# Patient Record
Sex: Male | Born: 1950 | Race: Black or African American | Hispanic: No | Marital: Married | Smoking: Never smoker
Health system: Southern US, Community
[De-identification: ages and names within clinical notes are randomized; demographics above are authoritative.]

## PROBLEM LIST (undated history)

## (undated) DIAGNOSIS — I10 Essential (primary) hypertension: Secondary | ICD-10-CM

## (undated) DIAGNOSIS — E785 Hyperlipidemia, unspecified: Secondary | ICD-10-CM

## (undated) DIAGNOSIS — I739 Peripheral vascular disease, unspecified: Secondary | ICD-10-CM

## (undated) DIAGNOSIS — M1990 Unspecified osteoarthritis, unspecified site: Secondary | ICD-10-CM

## (undated) HISTORY — PX: ESOPHAGOGASTRODUODENOSCOPY: SHX1529

## (undated) HISTORY — PX: HERNIA REPAIR: SHX51

## (undated) HISTORY — DX: Unspecified osteoarthritis, unspecified site: M19.90

## (undated) HISTORY — DX: Essential (primary) hypertension: I10

## (undated) HISTORY — DX: Hyperlipidemia, unspecified: E78.5

---

## 2012-03-27 LAB — URINALYSIS, COMPLETE
Bacteria: NONE SEEN
Bilirubin,UR: NEGATIVE
Blood: NEGATIVE
Glucose,UR: NEGATIVE mg/dL (ref 0–75)
Ketone: NEGATIVE
Leukocyte Esterase: NEGATIVE
Nitrite: NEGATIVE
Ph: 5 (ref 4.5–8.0)
Protein: 30
RBC,UR: 3 /HPF (ref 0–5)
Specific Gravity: 1.027 (ref 1.003–1.030)
Squamous Epithelial: 1
WBC UR: 4 /HPF (ref 0–5)

## 2012-03-27 LAB — BASIC METABOLIC PANEL
Anion Gap: 7 (ref 7–16)
BUN: 26 mg/dL — ABNORMAL HIGH (ref 7–18)
Calcium, Total: 7.9 mg/dL — ABNORMAL LOW (ref 8.5–10.1)
Chloride: 100 mmol/L (ref 98–107)
Co2: 31 mmol/L (ref 21–32)
Creatinine: 1.92 mg/dL — ABNORMAL HIGH (ref 0.60–1.30)
EGFR (African American): 43 — ABNORMAL LOW
EGFR (Non-African Amer.): 37 — ABNORMAL LOW
Glucose: 150 mg/dL — ABNORMAL HIGH (ref 65–99)
Osmolality: 283 (ref 275–301)
Potassium: 2.7 mmol/L — ABNORMAL LOW (ref 3.5–5.1)
Sodium: 138 mmol/L (ref 136–145)

## 2012-03-27 LAB — TROPONIN I: Troponin-I: 0.02 ng/mL

## 2012-03-27 LAB — LIPASE, BLOOD: Lipase: 129 U/L (ref 73–393)

## 2012-03-27 LAB — CBC
HCT: 47.1 % (ref 40.0–52.0)
HGB: 15.6 g/dL (ref 13.0–18.0)
MCH: 29.5 pg (ref 26.0–34.0)
MCHC: 33.1 g/dL (ref 32.0–36.0)
MCV: 89 fL (ref 80–100)
Platelet: 249 10*3/uL (ref 150–440)
RBC: 5.27 10*6/uL (ref 4.40–5.90)
RDW: 13.6 % (ref 11.5–14.5)
WBC: 8.6 10*3/uL (ref 3.8–10.6)

## 2012-03-28 ENCOUNTER — Inpatient Hospital Stay: Payer: Self-pay | Admitting: Surgery

## 2012-03-29 LAB — CBC WITH DIFFERENTIAL/PLATELET
Basophil #: 0 10*3/uL (ref 0.0–0.1)
Basophil %: 0.3 %
Eosinophil #: 0.3 10*3/uL (ref 0.0–0.7)
Eosinophil %: 3.5 %
HCT: 40.5 % (ref 40.0–52.0)
HGB: 13.3 g/dL (ref 13.0–18.0)
Lymphocyte #: 2.6 10*3/uL (ref 1.0–3.6)
Lymphocyte %: 33.4 %
MCH: 29.9 pg (ref 26.0–34.0)
MCHC: 32.9 g/dL (ref 32.0–36.0)
MCV: 91 fL (ref 80–100)
Monocyte #: 0.8 x10 3/mm (ref 0.2–1.0)
Monocyte %: 9.9 %
Neutrophil #: 4.1 10*3/uL (ref 1.4–6.5)
Neutrophil %: 52.9 %
Platelet: 198 10*3/uL (ref 150–440)
RBC: 4.46 10*6/uL (ref 4.40–5.90)
RDW: 13.6 % (ref 11.5–14.5)
WBC: 7.7 10*3/uL (ref 3.8–10.6)

## 2012-03-29 LAB — BASIC METABOLIC PANEL
Anion Gap: 6 — ABNORMAL LOW (ref 7–16)
BUN: 29 mg/dL — ABNORMAL HIGH (ref 7–18)
Calcium, Total: 7.4 mg/dL — ABNORMAL LOW (ref 8.5–10.1)
Chloride: 105 mmol/L (ref 98–107)
Co2: 29 mmol/L (ref 21–32)
Creatinine: 1.51 mg/dL — ABNORMAL HIGH (ref 0.60–1.30)
Glucose: 116 mg/dL — ABNORMAL HIGH (ref 65–99)
Osmolality: 286 (ref 275–301)
Potassium: 3.5 mmol/L (ref 3.5–5.1)
Sodium: 140 mmol/L (ref 136–145)

## 2013-12-07 ENCOUNTER — Ambulatory Visit: Payer: Self-pay | Admitting: Vascular Surgery

## 2013-12-07 LAB — CREATININE, SERUM
Creatinine: 1.41 mg/dL — ABNORMAL HIGH (ref 0.60–1.30)
EGFR (African American): >60
EGFR (Non-African Amer.): 54 — ABNORMAL LOW

## 2013-12-07 LAB — BUN: BUN: 24 mg/dL — ABNORMAL HIGH (ref 7–18)

## 2013-12-28 ENCOUNTER — Ambulatory Visit: Payer: Self-pay | Admitting: Vascular Surgery

## 2013-12-28 LAB — BASIC METABOLIC PANEL
Anion Gap: 8 (ref 7–16)
BUN: 21 mg/dL — ABNORMAL HIGH (ref 7–18)
Calcium, Total: 8.5 mg/dL (ref 8.5–10.1)
Chloride: 101 mmol/L (ref 98–107)
Co2: 33 mmol/L — ABNORMAL HIGH (ref 21–32)
Creatinine: 1.24 mg/dL (ref 0.60–1.30)
EGFR (African American): >60
EGFR (Non-African Amer.): >60
Glucose: 104 mg/dL — ABNORMAL HIGH (ref 65–99)
Osmolality: 286 (ref 275–301)
Potassium: 3 mmol/L — ABNORMAL LOW (ref 3.5–5.1)
Sodium: 142 mmol/L (ref 136–145)

## 2014-05-11 NOTE — Discharge Summary (Signed)
PATIENT NAME:  [name], [name] MR#:  [id number] DATE OF BIRTH:  12/07/1950  DATE OF ADMISSION:  03/28/2012 DATE OF DISCHARGE:  03/30/2012  BRIEF HISTORY: The patient is a 64 year old gentleman admitted through the Emergency Room with abdominal pain, nausea, vomiting and diarrhea. He had a 48-hour history of significant abdominal pain and diarrhea, followed by profound nausea and vomiting with fever and sweats. Thought he had a 24-hour bug but then began to develop significant abdominal crampy discomfort. He has had no previous similar symptoms. CT scan was performed in the Emergency Room, which demonstrated a markedly distended proximal bowel with distal decompressed small bowel, gas and stool in the colon suggestive of partial small bowel obstruction. His white blood cell count was normal. He was admitted to the hospital, placed on IV rehydration. He was not placed on nasogastric suction, as he had not had any vomiting since admission to the Emergency Room. He began to pass some gas later that day, was advanced to a liquid diet on the 11th and then to a soft diet on the 12th. He was discharged home on the 12th. He will follow up in the office in 7 to 10 days' time as necessary. He was discharged home on benazepril 20 mg once a day, hydrochlorothiazide 25 mg once a day, Cialis 20 mg once a day, Bystolic 5 mg once a day, benazepril/hydrochlorothiazide 10/12.5 mg once a day and Protonix 40 mg p.o. once a day.   FINAL DISCHARGE DIAGNOSIS: Abdominal pain, nausea, vomiting, diarrhea.   ____________________________ Carmie Endalph L. Ely III, MD rle:jm D: 04/07/2012 14:52:02 ET T: 04/07/2012 15:09:58 ET JOB#: [id number]  cc: Carmie Endalph L. Ely III, MD, <Dictator> Durward MallardJoel Wood. Marguerite OleaMoffett, MD Quentin OreALPH L ELY MD ELECTRONICALLY SIGNED 04/08/2012 16:27

## 2014-05-11 NOTE — H&P (Signed)
PATIENT NAME:  [name], [name] MR#:  [id number] DATE OF BIRTH:  December 05, 1950  DATE OF ADMISSION:  03/27/2012  PRIMARY CARE PHYSICIAN:  [name].  ADMITTING PHYSICIAN:  Dr. Michela Wood.   CHIEF COMPLAINT: Abdominal pain, nausea, vomiting, diarrhea.   BRIEF HISTORY:  The patient is a 64 year old gentleman with a 48-hour history of diarrhea and abdominal pain. Symptoms started 48 hours ago with significant diarrhea. He followed with profound nausea and vomiting with fever and sweats. He felt that he had a 24-hour viral syndrome. He continued to have significant vomiting and then felt as though he "bruised" something in his abdomen. He began to develop crampy abdominal pain with 10 to 15 seconds of abdominal pain every 20 to 30 minutes. The pain increased in intensity over the last 24 hours. He presented to the Emergency Room for further evaluation. He has had no vomiting in 24 hours. He is mildly nauseated. His diarrhea has stopped.   He denies any previous similar symptoms. He has not had any previous history of significant abdominal discomfort. He does not have any other abdominal history of note. He has no history of hepatitis, yellow jaundice, pancreatitis, peptic ulcer disease, gallbladder disease or diverticulitis. His only previous surgery was a groin hernia on the left. He denies any other abdominal surgery. He carries a diagnosis of abdominal wall hernia. He has had a previous colonoscopy almost 10 years ago now. It was unremarkable.   Only major medical problems are hypertension. He denies any cardiac disease or diabetes. He has no history of thyroid disease. He is followed by Dr. Jamse Wood. He was recently evaluated by Dr. Arnoldo Wood for his hypertension. He is under therapy and is not taking his potassium.   MEDICATIONS:  He does not remember what medicines he is on currently.   RADIOGRAPHIC AND LABORATORY DATA:  Workup in the Emergency Room revealed normal white blood cell count, hemoglobin.  Electrolytes demonstrate marked abnormalities with a BUN of 26, creatinine of 1.92, sodium of 138, chloride 100, potassium 2.7.  His GFR was slightly depressed at 43. Troponin and lipase were unremarkable. Liver function studies were not performed. Plain films demonstrated some distention in his small and large bowel. Noncontrasted CT scan demonstrated marked mismatch in bowel size in the small intestine with markedly distended proximal bowel and significantly decompressed distal bowel with some gas and stool in the colon. The initial impression was mechanical small bowel obstruction. No abdominal wall hernias were identified.   SOCIAL HISTORY: He works at the [employer].  He does not smoke cigarettes. Drinks alcohol only occasionally. He is accompanied by his family tonight.   REVIEW OF SYSTEMS:  Otherwise unremarkable with the exception of symptoms noted above.   FAMILY HISTORY:  Noncontributory to the current illness.   PHYSICAL EXAMINATION: GENERAL:  He is an alert, pleasant man in moderate distress from pain. He has not had any pain medication, and he rates his pain as 8.  VITALS:  Blood pressure 134/57, pulse is 66 and regular. He is afebrile. Oxygen saturation is normal on room air.  HEENT:  Revealed no scleral icterus. No pupillary abnormalities. No facial deformities.  NECK:  Supple, nontender with no adenopathy. Midline trachea.  CHEST:  Clear with no adventitious sounds. He has normal pulmonary excursion, although he does complain of some abdominal discomfort with deep inspiration.  CARDIAC:  No murmurs or gallops to my ear, and he seems to be in normal sinus rhythm.  ABDOMEN:  Distended with a  minimal abdominal tenderness. He has a small umbilical hernia  but fairly large ventral diastases. No abdominal wall hernia is identified other than the umbilical hernia. No groin hernias noted. He has no rebound, no guarding, no mass is noted.  EXTREMITIES:  Lower extremity exam reveals full range  of motion, no deformities.  PSYCHIATRIC:  Reveals normal orientation, normal affect.   ASSESSMENT AND PLAN:  I have independently reviewed his CT scan. He does not demonstrate any evidence of free air. He does have what appears to be mechanical small bowel obstruction and marked distention in the proximal bowel and decompressed in the distal bowel. There is air and gas in the colon.  He does not really have an etiology for this problem. There is no clearcut surgical history to suggest abdominal adhesions. We will plan to admit him to the hospital, place  him on IV rehydration to replenish his electrolytes, particularly his potassium, and perform nasogastric decompression. This plan has been discussed with the patient in detail, and he is in agreement. I do not see any indication for urgent surgical intervention. We will treat his pain, employ venous prophylaxis.  We have asked his wife to bring in his hypertensive medications. We may need a medical consult to help us treat his hypertension successfully. This plan has been discussed with the patient's family, and they are in agreement.     ____________________________ Marcus Endalph L. Wood III, MD rle:dmm D: 03/28/2012 05:20:00 ET T: 03/28/2012 10:17:05 ET JOB#: [id number]  cc: Marcus Endalph L. Wood III, MD, <Dictator> Marcus Isaacsonald E. Sherrie MustacheFisher, MD Marcus BlinksBruce J. Kowalski, MD Marcus OreALPH L ELY MD ELECTRONICALLY SIGNED 04/04/2012 7:17

## 2014-05-12 NOTE — Op Note (Signed)
PATIENT NAME:  [name], [name] MR#:  [id number] DATE OF BIRTH:  May 30, 1950  DATE OF PROCEDURE:  12/07/2013  PREOPERATIVE DIAGNOSES: 1.  Peripheral arterial disease with claudication, bilateral lower extremities.  2.  Hypertension.  3.  Hyperlipidemia.   POSTOPERATIVE DIAGNOSES: 1.  Peripheral arterial disease with claudication, bilateral lower extremities.  2.  Hypertension.  3.  Hyperlipidemia.   PROCEDURE: 1.  Ultrasound guidance for vascular access to left femoral artery.  2.  Catheter placement in the right posterior tibial artery from left femoral approach.  3.  Aortogram and selective right lower extremity angiogram.  4. Percutaneous transluminal angioplasty with drug-coated angioplasty balloon with a 4 mm diameter x 10 cm length Lutonix balloon to the tibioperoneal trunk and proximal posterior tibial artery.  5.  Percutaneous transluminal angioplasty with 6 mm diameter x 15 cm length drug-coated Lutonix angioplasty balloon to the right superficial femoral artery.  6.  StarClose closure device, left femoral artery.   SURGEON: Annice NeedyJason S Dew, M.D.   ANESTHESIA: Local with moderate conscious sedation.   ESTIMATED BLOOD LOSS: Minimal.   FLUOROSCOPY TIME: Five minutes and 70 mL of contrast were used.   INDICATION FOR PROCEDURE: A 64 year old gentleman with peripheral arterial disease and short distance claudication. He has been seen in the office. He has bilateral disease. Both lower extremities are about equally affected and we are working on the right lower extremity today. Risks and benefits were discussed. Informed consent was obtained.   DESCRIPTION OF PROCEDURE: The patient was brought to the vascular suite. Groins were shaved and prepped, and a sterile surgical field was created. The left femoral head was localized with fluoroscopy and left femoral artery was visualized with the ultrasound and found to be widely patent. It was then accessed under direct ultrasound guidance  without difficulty with Seldinger needle. A J-wire and 5 French sheath were placed. Pigtail catheter was placed in the aorta at the L1-L2 level and AP aortogram was performed. This demonstrated normal renal arteries bilaterally. 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 These appeared to be moderate in size. There were no stenoses within the aorta and iliac segments. I then crossed the aortic bifurcation and advanced to the right femoral head and selective right lower extremity angiogram was then performed. This demonstrated very mild stenosis in the origin of the proximal superficial femoral artery of less than 30%. This was not flow limiting. In the mid to distal superficial femoral artery was an area of about 10 to 12 cm of a very diseased artery with significant stenosis of greater than 85% to 90% at its worst.  The artery then normalized through the popliteal artery.  The tibioperoneal trunk was diseased and had a moderate amount of stenosis in the 60% to 70% range and the origins of the posterior tibial artery appeared to have a higher degree of stenosis and the peroneal artery also had some stenosis as well. The anterior tibial artery was chronically occluded. The patient was given 4000 units of intravenous heparin for systemic anticoagulation. A 6 French Ansell sheath was placed over a [employer] Advant[employer] wire. I was able to cross through the SFA lesion without difficulty and then through the tibioperoneal trunk lesion without difficulty. Initially, the wire would not track down the peroneal artery. The posterior tibial artery was the more diseased proximally, but the better  runoff distally and so I used a Kumpe catheter to navigate through this and get into the posterior tibial  artery. A 4 mm diameter x 10 cm Lutonix drug-coated angioplasty balloon was inflated in the tibioperoneal trunk and proximal posterior tibial artery with good angiographic completion result. I  elected to leave the moderate stenosis at the origin of the peroneal artery in fear of harming the posterior tibial artery origin. I then turned my attention to the SFA. This was treated with a 6 mm diameter x 15 cm in length Lutonix drug-coated angioplasty balloon, which encompassed the lesion. Tight waist was seen which resolved with angioplasty at 12 atmospheres and the inflation was held for 1 minute. Completion angiogram showed the area to be patent. There was about a 10% to 20% residual stenosis proximally in the disease without a significant dissection and I elected to leave this area without stent placement due to the non-flow-limiting nature of the residual stenosis. At this point, I terminated my procedure. The sheath was removed. StarClose closure device was deployed in the usual fashion with excellent hemostatic result. The patient tolerated the procedure well and was taken to the recovery room in stable condition.   ____________________________ Annice Needy, MD jsd:DT D: 12/07/2013 10:05:41 ET T: 12/07/2013 12:47:49 ET JOB#: [id number]  cc: Annice Needy, MD, <Dictator> Annice Needy MD ELECTRONICALLY SIGNED 12/31/2013 11:40

## 2014-05-12 NOTE — Op Note (Signed)
PATIENT NAME:  [name], [name] DATE OF BIRTH:  1950/11/06  DATE OF PROCEDURE:  12/28/2013  PREOPERATIVE DIAGNOSES:  Peripheral arterial disease with claudication, bilateral lower extremities, status post right lower extremity revascularization with good results.   POSTOPERATIVE DIAGNOSIS:  Peripheral arterial disease with claudication, bilateral lower extremities, status post right lower extremity revascularization with good results.   PROCEDURE:  1.  Ultrasound guidance for vascular access to right femoral artery.  2.  Catheter placement to left peroneal artery from right femoral approach.  3.  Left lower extremity angiogram.  4.  Percutaneous transluminal angioplasty of left tibioperoneal trunk and peroneal artery proximally with 4 mm diameter Lutonix drug-coated angioplasty balloon.  5.  Percutaneous transluminal angioplasty of mid to distal left superficial femoral artery with 6 mm diameter Lutonix drug-coated angioplasty balloon.  6.  StarClose closure device, right femoral artery.   SURGEON: Annice NeedyJason S Dew, MD   ANESTHESIA: Local with moderate conscious sedation.   ESTIMATED BLOOD LOSS: Approximately 25 mL.   INDICATION FOR PROCEDURE: This is a 64 year old gentleman with peripheral arterial disease; he has already undergone right lower extremity revascularization with good results. He desires revascularization of his left lower extremity for short distance claudication. Risks and benefits were discussed. Informed consent was obtained.   DESCRIPTION OF PROCEDURE: The patient is brought to the vascular suite. Groins were shaved and prepped and a sterile surgical field was created. Ultrasound was used to visualize the femoral artery on the right. It was accessed under direct ultrasound guidance without difficulty with a Seldinger needle. An Advantage wire was then placed, and a 6 [place] Ansell sheath was placed over the [employer] wire and we were able to easily cross  the aortic bifurcation and advanced to the left common femoral artery. Selective left lower extremity angiogram was then performed. This showed about a 40% stenosis in the proximal superficial femoral arteries and origin. The mid to distal superficial femoral artery had a very irregular stenosis that was nearly occlusive over several segments in a 6 to 8 cm span and then mildly diseased just above and below this. There was then 1 vessel runoff but there was about an 80% stenosis in the tibioperoneal trunk and into the proximal peroneal artery. The patient was given 4000 units of intravenous heparin for systemic anticoagulation and I crossed the stenosis and occlusions without difficulty and confirmed intraluminal flow in the peroneal artery. I then replaced the advantage wire. A 4 mm diameter drug-coated angioplasty balloon was inflated in the tibioperoneal trunk and proximal peroneal artery. Narrowing was seen, which resolved with angioplasty and completion angiogram showed this area to be widely patent without significant residual stenosis.   I then treated the SFA with a drug-coated angioplasty balloon. I used a 6 mm diameter x 15 cm in length Lutonix drug-coated angioplasty balloon. Again narrowing was seen, which the waist was broken at about 8 to 10 atmospheres and completion angiogram showed markedly improved flow. There was about a 20% to 30% residual stenosis of the distal area of the angioplasty. This was not flow limiting, and a significant dissection that was not seen. I elected not to treat the proximal SFA stenosis that did not appear flow limiting.   The sheath was then pulled back to the ipsilateral external iliac artery and oblique arteriogram was performed. A StarClose closure device was deployed in the usual fashion with excellent hemostatic result.   The patient tolerated the procedure well and was taken to the recovery  room in stable condition.    ____________________________ Annice Needy, MD jsd:nt D: 12/28/2013 11:45:13 ET T: 12/28/2013 22:31:55 ET JOB#: [id number]  cc: Annice Needy, MD, <Dictator> Annice Needy MD ELECTRONICALLY SIGNED 01/18/2014 14:16

## 2014-05-14 ENCOUNTER — Other Ambulatory Visit: Payer: Self-pay | Admitting: Physical Medicine and Rehabilitation

## 2014-05-14 DIAGNOSIS — M5416 Radiculopathy, lumbar region: Secondary | ICD-10-CM

## 2014-05-29 ENCOUNTER — Ambulatory Visit
Admission: RE | Admit: 2014-05-29 | Discharge: 2014-05-29 | Disposition: A | Discharge status: Home / Self Care | Payer: Managed Care, Other (non HMO) | Source: Ambulatory Visit | Attending: Physical Medicine and Rehabilitation | Admitting: Physical Medicine and Rehabilitation

## 2014-05-29 DIAGNOSIS — M545 Low back pain: Secondary | ICD-10-CM | POA: Diagnosis present

## 2014-05-29 DIAGNOSIS — M4696 Unspecified inflammatory spondylopathy, lumbar region: Secondary | ICD-10-CM | POA: Diagnosis not present

## 2014-05-29 DIAGNOSIS — M5416 Radiculopathy, lumbar region: Secondary | ICD-10-CM

## 2014-05-30 ENCOUNTER — Other Ambulatory Visit: Payer: Self-pay | Admitting: Physical Medicine and Rehabilitation

## 2014-05-30 DIAGNOSIS — M5416 Radiculopathy, lumbar region: Secondary | ICD-10-CM

## 2014-06-06 ENCOUNTER — Ambulatory Visit: Payer: Managed Care, Other (non HMO)

## 2014-07-18 ENCOUNTER — Telehealth: Payer: Self-pay | Admitting: Emergency Medicine

## 2014-07-18 MED ORDER — BENAZEPRIL-HYDROCHLOROTHIAZIDE 20-25 MG PO TABS: 2.0000 | ORAL_TABLET | Freq: Every day | ORAL | Status: DC

## 2014-07-18 NOTE — Telephone Encounter (Signed)
Pt pharmacy sent over request for refill for pt medication.  

## 2014-08-22 ENCOUNTER — Other Ambulatory Visit: Payer: Self-pay | Admitting: Family Medicine

## 2014-09-10 ENCOUNTER — Other Ambulatory Visit: Payer: Self-pay | Admitting: Family Medicine

## 2014-10-02 ENCOUNTER — Other Ambulatory Visit: Payer: Self-pay | Admitting: Family Medicine

## 2015-02-25 ENCOUNTER — Ambulatory Visit: Payer: Managed Care, Other (non HMO) | Admitting: Physical Therapy

## 2015-02-28 ENCOUNTER — Encounter: Payer: Managed Care, Other (non HMO) | Admitting: Physical Therapy

## 2015-03-04 ENCOUNTER — Encounter: Payer: Managed Care, Other (non HMO) | Admitting: Physical Therapy

## 2015-03-07 ENCOUNTER — Encounter: Payer: Managed Care, Other (non HMO) | Admitting: Physical Therapy

## 2015-03-11 ENCOUNTER — Encounter: Payer: Managed Care, Other (non HMO) | Admitting: Physical Therapy

## 2015-03-14 ENCOUNTER — Encounter: Payer: Managed Care, Other (non HMO) | Admitting: Physical Therapy

## 2015-03-18 ENCOUNTER — Encounter: Payer: Managed Care, Other (non HMO) | Admitting: Physical Therapy

## 2015-03-21 ENCOUNTER — Encounter: Payer: Managed Care, Other (non HMO) | Admitting: Physical Therapy

## 2016-07-27 ENCOUNTER — Other Ambulatory Visit (INDEPENDENT_AMBULATORY_CARE_PROVIDER_SITE_OTHER): Payer: Self-pay | Admitting: Vascular Surgery

## 2016-07-27 DIAGNOSIS — I723 Aneurysm of iliac artery: Secondary | ICD-10-CM

## 2016-07-28 ENCOUNTER — Ambulatory Visit (INDEPENDENT_AMBULATORY_CARE_PROVIDER_SITE_OTHER): Payer: Managed Care, Other (non HMO)

## 2016-07-28 ENCOUNTER — Ambulatory Visit (INDEPENDENT_AMBULATORY_CARE_PROVIDER_SITE_OTHER): Payer: Self-pay | Admitting: Vascular Surgery

## 2016-10-20 ENCOUNTER — Encounter (INDEPENDENT_AMBULATORY_CARE_PROVIDER_SITE_OTHER): Payer: Managed Care, Other (non HMO)

## 2016-10-20 ENCOUNTER — Encounter (INDEPENDENT_AMBULATORY_CARE_PROVIDER_SITE_OTHER): Payer: Self-pay

## 2016-10-20 ENCOUNTER — Ambulatory Visit (INDEPENDENT_AMBULATORY_CARE_PROVIDER_SITE_OTHER): Payer: Self-pay | Admitting: Vascular Surgery

## 2017-12-07 ENCOUNTER — Encounter (INDEPENDENT_AMBULATORY_CARE_PROVIDER_SITE_OTHER): Payer: Self-pay | Admitting: Vascular Surgery

## 2017-12-07 ENCOUNTER — Ambulatory Visit (INDEPENDENT_AMBULATORY_CARE_PROVIDER_SITE_OTHER): Payer: Medicare Other | Admitting: Vascular Surgery

## 2017-12-07 VITALS — BP 174/83 | HR 62 | Resp 16 | Ht 69.5 in | Wt 266.4 lb

## 2017-12-07 DIAGNOSIS — I70213 Atherosclerosis of native arteries of extremities with intermittent claudication, bilateral legs: Secondary | ICD-10-CM

## 2017-12-07 DIAGNOSIS — I10 Essential (primary) hypertension: Secondary | ICD-10-CM | POA: Diagnosis not present

## 2017-12-07 DIAGNOSIS — E785 Hyperlipidemia, unspecified: Secondary | ICD-10-CM

## 2017-12-07 DIAGNOSIS — I70219 Atherosclerosis of native arteries of extremities with intermittent claudication, unspecified extremity: Secondary | ICD-10-CM | POA: Insufficient documentation

## 2017-12-07 NOTE — Patient Instructions (Signed)

## 2017-12-07 NOTE — Assessment & Plan Note (Signed)
blood pressure control important in reducing the progression of atherosclerotic disease. On appropriate oral medications.  

## 2017-12-07 NOTE — Assessment & Plan Note (Signed)
lipid control important in reducing the progression of atherosclerotic disease. Continue statin therapy  

## 2017-12-07 NOTE — Assessment & Plan Note (Signed)
Recommend:  Patient should undergo arterial duplex of the lower extremity ASAP because there has been a significant deterioration in the patient's lower extremity symptoms.  The patient states they are having increased pain and a marked decrease in the distance that they can walk.  The risks and benefits as well as the alternatives were discussed in detail with the patient.  All questions were answered.  Patient agrees to proceed and understands this could be a prelude to angiography and intervention.  The patient will follow up with me in the office to review the studies.  

## 2017-12-07 NOTE — Progress Notes (Signed)
Patient ID: [name], male   DOB: 16-Jun-1950, 67 y.o.   MRN: [id number]  Chief Complaint  Patient presents with  . Follow-up    ref [facility] for pvd    HPI [name] is a 67 y.o. male.  I am asked to see the patient by Phil Dopp, PA-C for evaluation of PAD.  The patient reports worsening claudication symptoms and both lower extremities.  This has been steadily progressive over the past year or so.  About 4 years ago, I performed staged bilateral lower extremity revascularization procedures for short distance claudication.  I am not sure when the last time the patient followed up but it has been at least 2 to 3 years as we do not have it on our current computer system.  My last note that I can find was from 4 years ago.  He does not have ulceration or infection.  He has no fever or chills.  He does not really describe rest pain.   Past Medical History:  Diagnosis Date  . Arthritis   . Hyperlipidemia   . Hypertension     Past surgical history Bilateral lower extremity angiograms with revascularization in 2015  Family History No bleeding disorders, clotting disorders, aneurysms, or autoimmune diseases  Social History Social History   Tobacco Use  . Smoking status: Never Smoker  . Smokeless tobacco: Never Used  Substance Use Topics  . Alcohol use: Yes  . Drug use: Never     No Known Allergies  Current Outpatient Medications  Medication Sig Dispense Refill  . amLODipine (NORVASC) 10 MG tablet Take by mouth.    [name] aspirin 325 MG tablet Take by mouth.    [name] atorvastatin (LIPITOR) 40 MG tablet Take by mouth.    . benazepril-hydrochlorthiazide (LOTENSIN HCT) 20-25 MG per tablet TAKE TWO (2) TABLETS BY MOUTH DAILY 60 tablet 0  . BYSTOLIC 5 MG tablet TAKE ONE (1) TABLET BY MOUTH EVERY DAY 30 tablet 0  . potassium chloride SA (K-DUR,KLOR-CON) 20 MEQ tablet Take by mouth.    . sildenafil (VIAGRA) 25 MG tablet Take by mouth.     No current facility-administered  medications for this visit.       REVIEW OF SYSTEMS (Negative unless checked)  Constitutional: [] Weight loss  [] Fever  [] Chills Cardiac: [] Chest pain   [] Chest pressure   [] Palpitations   [] Shortness of breath when laying flat   [] Shortness of breath at rest   [x] Shortness of breath with exertion. Vascular:  [x] Pain in legs with walking   [] Pain in legs at rest   [] Pain in legs when laying flat   [x] Claudication   [] Pain in feet when walking  [] Pain in feet at rest  [] Pain in feet when laying flat   [] History of DVT   [] Phlebitis   [x] Swelling in legs   [] Varicose veins   [] Non-healing ulcers Pulmonary:   [] Uses home oxygen   [] Productive cough   [] Hemoptysis   [] Wheeze  [] COPD   [] Asthma Neurologic:  [] Dizziness  [] Blackouts   [] Seizures   [] History of stroke   [] History of TIA  [] Aphasia   [] Temporary blindness   [] Dysphagia   [] Weakness or numbness in arms   [] Weakness or numbness in legs Musculoskeletal:  [x] Arthritis   [] Joint swelling   [] Joint pain   [] Low back pain Hematologic:  [] Easy bruising  [] Easy bleeding   [] Hypercoagulable state   [] Anemic  [] Hepatitis Gastrointestinal:  [] Blood in stool   [] Vomiting blood  [] Gastroesophageal  reflux/heartburn   [] Abdominal pain Genitourinary:  [] Chronic kidney disease   [] Difficult urination  [] Frequent urination  [] Burning with urination   [] Hematuria Skin:  [] Rashes   [] Ulcers   [] Wounds Psychological:  [] History of anxiety   []  History of major depression.    Physical Exam BP (!) 174/83 (BP Location: Right Arm)   Pulse 62   Resp 16   Ht 5' 9.5" (1.765 m)   Wt 266 lb 6.4 oz (120.8 kg)   BMI 38.78 kg/m  Gen:  WD/WN, NAD Head: Kell/AT, No temporalis wasting. Ear/Nose/Throat: Hearing grossly intact, nares w/o erythema or drainage, oropharynx w/o Erythema/Exudate Eyes: Conjunctiva clear, sclera non-icteric  Neck: trachea midline.  No bruit or JVD.  Pulmonary:  Good air movement, respirations not labored, no use of accessory  muscles Cardiac: RRR, no JVD Vascular:  Vessel Right Left  Radial Palpable Palpable                          PT  1+ palpable  not palpable  DP  trace palpable  1+ palpable   Gastrointestinal: soft, non-tender/non-distended.  Musculoskeletal: M/S 5/5 throughout.  Extremities without ischemic changes.  No deformity or atrophy.  1-2+ bilateral lower extremity edema. Neurologic: Sensation grossly intact in extremities.  Symmetrical.  Speech is fluent. Motor exam as listed above. Psychiatric: Judgment intact, Mood & affect appropriate for pt's clinical situation. Dermatologic: No rashes or ulcers noted.  No cellulitis or open wounds.    Radiology No results found.  Labs No results found for this or any previous visit (from the past 2160 hour(s)).  Assessment/Plan:  Hyperlipidemia lipid control important in reducing the progression of atherosclerotic disease. Continue statin therapy   Hypertension blood pressure control important in reducing the progression of atherosclerotic disease. On appropriate oral medications.   Atherosclerosis of native arteries of extremity with intermittent claudication (HCC) Recommend:  Patient should undergo arterial duplex of the lower extremity ASAP because there has been a significant deterioration in the patient's lower extremity symptoms.  The patient states they are having increased pain and a marked decrease in the distance that they can walk.  The risks and benefits as well as the alternatives were discussed in detail with the patient.  All questions were answered.  Patient agrees to proceed and understands this could be a prelude to angiography and intervention.  The patient will follow up with me in the office to review the studies.       [name] 12/07/2017, 9:50 AM   This note was created with Dragon medical transcription system.  Any errors from dictation are unintentional.

## 2017-12-20 ENCOUNTER — Encounter (INDEPENDENT_AMBULATORY_CARE_PROVIDER_SITE_OTHER): Payer: Self-pay | Admitting: Nurse Practitioner

## 2017-12-20 ENCOUNTER — Ambulatory Visit (INDEPENDENT_AMBULATORY_CARE_PROVIDER_SITE_OTHER): Payer: Medicare Other

## 2017-12-20 ENCOUNTER — Ambulatory Visit (INDEPENDENT_AMBULATORY_CARE_PROVIDER_SITE_OTHER): Payer: Medicare Other | Admitting: Nurse Practitioner

## 2017-12-20 ENCOUNTER — Other Ambulatory Visit (INDEPENDENT_AMBULATORY_CARE_PROVIDER_SITE_OTHER): Payer: Self-pay | Admitting: Nurse Practitioner

## 2017-12-20 VITALS — BP 153/80 | HR 73 | Resp 16 | Ht 69.5 in | Wt 270.0 lb

## 2017-12-20 DIAGNOSIS — I70213 Atherosclerosis of native arteries of extremities with intermittent claudication, bilateral legs: Secondary | ICD-10-CM

## 2017-12-20 DIAGNOSIS — I10 Essential (primary) hypertension: Secondary | ICD-10-CM | POA: Diagnosis not present

## 2017-12-20 DIAGNOSIS — E785 Hyperlipidemia, unspecified: Secondary | ICD-10-CM

## 2017-12-20 NOTE — Progress Notes (Signed)
Subjective:    Patient ID: Marcus KinLarry B Szuch, male    DOB: 01/29/1950, 67 y.o.   MRN: [id number] Chief Complaint  Patient presents with  . Follow-up    ultrasound follow up    HPI  [name] is a 67 y.o. male presents today for follow-up test after previous visit with complaints of bilateral claudication.  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  He states that the pain is in his calf and radiates down his foot.  He states that the pain that he is having is similar to his pain that he was having before his bilateral lower extremity angiogram several years ago.  The patient denies any ulceration or infection of the bilateral lower extremities.  He denies any fever, chills, nausea, vomiting or diarrhea.  He does not describe rest pain.  Past Medical History:  Diagnosis Date  . Arthritis   . Hyperlipidemia   . Hypertension     Past Surgical History:  Procedure Laterality Date  . HERNIA REPAIR      Social History   Socioeconomic History  . Marital status: Married    Spouse name: Not on file  . Number of children: Not on file  . Years of education: Not on file  . Highest education level: Not on file  Occupational History  . Not on file  Social Needs  . Financial resource strain: Not on file  . Food insecurity:    Worry: Not on file    Inability: Not on file  . Transportation needs:    Medical: Not on file    Non-medical: Not on file  Tobacco Use  . Smoking status: Never Smoker  . Smokeless tobacco: Never Used  Substance and Sexual Activity  . Alcohol use: Yes  . Drug use: Never  . Sexual activity: Not on file  Lifestyle  . Physical activity:    Days per week: Not on file    Minutes per session: Not on file  . Stress: Not on file  Relationships  . Social connections:   Talks on phone: Not on file    Gets together: Not on file    Attends religious service: Not on file    Active member of club or organization: Not on file    Attends meetings of clubs or organizations: Not on file    Relationship status: Not on file  . Intimate partner violence:    Fear of current or ex partner: Not on file    Emotionally abused: Not on file    Physically abused: Not on file    Forced sexual activity: Not on file  Other Topics Concern  . Not on file  Social History Narrative  . Not on file    History reviewed. No pertinent family history.  No Known Allergies   Review of Systems   Review of Systems: Negative Unless Checked Constitutional: [] Weight loss  [] Fever  [] Chills Cardiac: [] Chest pain   []  Atrial Fibrillation  [] Palpitations   [] Shortness of breath when laying flat   [x] Shortness of breath with exertion. Vascular:  [x] Pain in legs with walking   [] Pain in legs with standing  [] History of DVT   [] Phlebitis   [x] Swelling in legs   [] Varicose veins   [] Non-healing ulcers Pulmonary:   [] Uses home oxygen   []   Productive cough   [] Hemoptysis   [] Wheeze  [] COPD   [] Asthma Neurologic:  [] Dizziness   [] Seizures   [] History of stroke   [] History of TIA  [] Aphasia   [] Vissual changes   [] Weakness or numbness in arm   [] Weakness or numbness in leg Musculoskeletal:   [] Joint swelling   [x] Joint pain   [] Low back pain  []  History of Knee Replacement Hematologic:  [] Easy bruising  [] Easy bleeding   [] Hypercoagulable state   [] Anemic Gastrointestinal:  [] Diarrhea   [] Vomiting  [] Gastroesophageal reflux/heartburn   [] Difficulty swallowing. Genitourinary:  [] Chronic kidney disease   [] Difficult urination  [] Anuric   [] Blood in urine Skin:  [] Rashes   [] Ulcers  Psychological:  [] History of anxiety   []  History of major depression  []  Memory Difficulties      Objective:   Physical Exam  BP (!) 153/80 (BP Location: Right Arm)   Pulse 73   Resp 16   Ht 5' 9.5" (1.765 m)    Wt 270 lb (122.5 kg)   BMI 39.30 kg/m   Gen: WD/WN, NAD Head: [place]/AT, No temporalis wasting.  Ear/Nose/Throat: Hearing grossly intact, nares w/o erythema or drainage Eyes: PER, EOMI, sclera nonicteric.  Neck: Supple, no masses.  No JVD.  Pulmonary:  Good air movement, no use of accessory muscles.  Cardiac: RRR Vascular:  1+ bilateral lower extremity edema Vessel Right Left  Radial Palpable Palpable  Dorsalis Pedis Not Palpable Not Palpable  Posterior Tibial Trace Palpable Trace Palpable   Gastrointestinal: soft, non-distended. No guarding/no peritoneal signs.  Musculoskeletal: M/S 5/5 throughout.  No deformity or atrophy.  Neurologic: Pain and light touch intact in extremities.  Symmetrical.  Speech is fluent. Motor exam as listed above. Psychiatric: Judgment intact, Mood & affect appropriate for pt's clinical situation. Dermatologic: No Venous rashes. No Ulcers Noted.  No changes consistent with cellulitis. Lymph : No Cervical lymphadenopathy, no lichenification or skin changes of chronic lymphedema.      Assessment & Plan:   1. Atherosclerosis of native artery of both lower extremities with intermittent claudication (HCC) Based on patient's bilateral lower extremity duplex he has occlusion of the bilateral anterior tibial arteries and his description of symptoms are certainly consistent with claudication.  However, given his ABIs I discussed the possibility with the patient that this may not completely relieve his symptoms.  Patient understands.  Recommend:  The patient has experienced increased symptoms and is now describing lifestyle limiting claudication.  Given the severity of the patient's right  lower extremity symptoms the patient should undergo angiography and intervention.  Risk and benefits were reviewed the patient.  Indications for the procedure were reviewed.  All questions were answered, the patient agrees to proceed.   The patient should continue walking and begin  a more formal exercise program.  The patient should continue antiplatelet therapy and aggressive treatment of the lipid abnormalities   The patient will follow up with me after the angiogram.   2. Hyperlipidemia, unspecified hyperlipidemia type Continue statin as ordered and reviewed, no changes at this time   3. Essential hypertension Continue antihypertensive medications as already ordered, these medications have been reviewed and there are no changes at this time.    Current Outpatient Medications on File Prior to Visit  Medication Sig Dispense Refill  . amLODipine (NORVASC) 10 MG tablet Take by mouth.    [name] aspirin 325 MG tablet Take by mouth.    [name] atorvastatin (LIPITOR) 40 MG tablet Take by mouth.    [name]  benazepril-hydrochlorthiazide (LOTENSIN HCT) 20-25 MG per tablet TAKE TWO (2) TABLETS BY MOUTH DAILY 60 tablet 0  . BYSTOLIC 5 MG tablet TAKE ONE (1) TABLET BY MOUTH EVERY DAY 30 tablet 0  . potassium chloride SA (K-DUR,KLOR-CON) 20 MEQ tablet Take by mouth.    . sildenafil (VIAGRA) 25 MG tablet Take by mouth.     No current facility-administered medications on file prior to visit.     There are no Patient Instructions on file for this visit. No follow-ups on file.   Georgiana Spinner, NP  This note was completed with Office manager.  Any errors are purely unintentional.

## 2017-12-22 ENCOUNTER — Encounter (INDEPENDENT_AMBULATORY_CARE_PROVIDER_SITE_OTHER): Payer: Self-pay

## 2018-01-18 ENCOUNTER — Other Ambulatory Visit (INDEPENDENT_AMBULATORY_CARE_PROVIDER_SITE_OTHER): Payer: Self-pay | Admitting: Nurse Practitioner

## 2018-01-21 ENCOUNTER — Encounter
Admission: RE | Admit: 2018-01-21 | Discharge: 2018-01-21 | Disposition: A | Discharge status: Home / Self Care | Payer: Non-veteran care | Source: Ambulatory Visit | Attending: Vascular Surgery | Admitting: Vascular Surgery

## 2018-01-21 DIAGNOSIS — I7090 Unspecified atherosclerosis: Secondary | ICD-10-CM | POA: Insufficient documentation

## 2018-01-21 DIAGNOSIS — Z01812 Encounter for preprocedural laboratory examination: Secondary | ICD-10-CM | POA: Insufficient documentation

## 2018-01-21 HISTORY — DX: Peripheral vascular disease, unspecified: I73.9

## 2018-01-21 LAB — CREATININE, SERUM
Creatinine, Ser: 1.16 mg/dL (ref 0.61–1.24)
GFR calc Af Amer: >60 mL/min (ref >60)
GFR calc non Af Amer: >60 mL/min (ref >60)

## 2018-01-21 LAB — BUN: BUN: 23 mg/dL (ref 8–23)

## 2018-01-24 ENCOUNTER — Other Ambulatory Visit: Payer: Self-pay

## 2018-01-24 ENCOUNTER — Ambulatory Visit
Admission: RE | Admit: 2018-01-24 | Discharge: 2018-01-24 | Disposition: A | Discharge status: Home / Self Care | Payer: No Typology Code available for payment source | Attending: Vascular Surgery | Admitting: Vascular Surgery

## 2018-01-24 ENCOUNTER — Encounter
Admission: RE | Disposition: A | Discharge status: Home / Self Care | Payer: Self-pay | Source: Home / Self Care | Attending: Vascular Surgery

## 2018-01-24 DIAGNOSIS — E785 Hyperlipidemia, unspecified: Secondary | ICD-10-CM | POA: Insufficient documentation

## 2018-01-24 DIAGNOSIS — I7092 Chronic total occlusion of artery of the extremities: Secondary | ICD-10-CM

## 2018-01-24 DIAGNOSIS — M1990 Unspecified osteoarthritis, unspecified site: Secondary | ICD-10-CM | POA: Insufficient documentation

## 2018-01-24 DIAGNOSIS — I10 Essential (primary) hypertension: Secondary | ICD-10-CM | POA: Insufficient documentation

## 2018-01-24 DIAGNOSIS — M79606 Pain in leg, unspecified: Secondary | ICD-10-CM

## 2018-01-24 DIAGNOSIS — I70213 Atherosclerosis of native arteries of extremities with intermittent claudication, bilateral legs: Secondary | ICD-10-CM | POA: Insufficient documentation

## 2018-01-24 DIAGNOSIS — I70219 Atherosclerosis of native arteries of extremities with intermittent claudication, unspecified extremity: Secondary | ICD-10-CM

## 2018-01-24 HISTORY — PX: LOWER EXTREMITY ANGIOGRAPHY: CATH118251

## 2018-01-24 SURGERY — LOWER EXTREMITY ANGIOGRAPHY
Anesthesia: Moderate Sedation | Laterality: Right

## 2018-01-24 MED ORDER — SODIUM CHLORIDE 0.9% FLUSH: 3.0000 mL | INTRAVENOUS | Status: DC | PRN

## 2018-01-24 MED ORDER — HYDROMORPHONE HCL 1 MG/ML IJ SOLN: 1.0000 mg | Freq: Once | INTRAMUSCULAR | Status: DC | PRN

## 2018-01-24 MED ORDER — CEFAZOLIN SODIUM-DEXTROSE 2-4 GM/100ML-% IV SOLN
INTRAVENOUS | Status: AC
  Filled 2018-01-24: qty 100

## 2018-01-24 MED ORDER — ONDANSETRON HCL 4 MG/2ML IJ SOLN: 4.0000 mg | Freq: Four times a day (QID) | INTRAMUSCULAR | Status: DC | PRN

## 2018-01-24 MED ORDER — IOPAMIDOL (ISOVUE-300) INJECTION 61%
INTRAVENOUS | Status: DC | PRN
  Administered 2018-01-24: 50 mL via INTRAVENOUS

## 2018-01-24 MED ORDER — ACETAMINOPHEN 325 MG PO TABS
ORAL_TABLET | ORAL | Status: AC
  Filled 2018-01-24: qty 2

## 2018-01-24 MED ORDER — HEPARIN SODIUM (PORCINE) 1000 UNIT/ML IJ SOLN
INTRAMUSCULAR | Status: AC
  Filled 2018-01-24: qty 1

## 2018-01-24 MED ORDER — FENTANYL CITRATE (PF) 100 MCG/2ML IJ SOLN
INTRAMUSCULAR | Status: DC | PRN
  Administered 2018-01-24: 25 ug via INTRAVENOUS
  Administered 2018-01-24: 50 ug via INTRAVENOUS

## 2018-01-24 MED ORDER — SODIUM CHLORIDE 0.9 % IV SOLN: 250.0000 mL | INTRAVENOUS | Status: DC | PRN

## 2018-01-24 MED ORDER — HYDRALAZINE HCL 20 MG/ML IJ SOLN
INTRAMUSCULAR | Status: DC | PRN
  Administered 2018-01-24: 10 mg via INTRAVENOUS

## 2018-01-24 MED ORDER — HEPARIN SODIUM (PORCINE) 1000 UNIT/ML IJ SOLN
INTRAMUSCULAR | Status: DC | PRN
  Administered 2018-01-24: 5000 [IU] via INTRAVENOUS

## 2018-01-24 MED ORDER — HYDRALAZINE HCL 20 MG/ML IJ SOLN
5.0000 mg | INTRAMUSCULAR | Status: DC | PRN
  Administered 2018-01-24: 5 mg via INTRAVENOUS

## 2018-01-24 MED ORDER — SODIUM CHLORIDE 0.9% FLUSH: 3.0000 mL | Freq: Two times a day (BID) | INTRAVENOUS | Status: DC

## 2018-01-24 MED ORDER — FENTANYL CITRATE (PF) 100 MCG/2ML IJ SOLN
INTRAMUSCULAR | Status: AC
  Filled 2018-01-24: qty 2

## 2018-01-24 MED ORDER — SODIUM CHLORIDE 0.9 % IV SOLN
INTRAVENOUS | Status: DC
  Administered 2018-01-24: 1000 mL via INTRAVENOUS

## 2018-01-24 MED ORDER — HYDRALAZINE HCL 20 MG/ML IJ SOLN
INTRAMUSCULAR | Status: AC
  Filled 2018-01-24: qty 1

## 2018-01-24 MED ORDER — SODIUM CHLORIDE 0.9 % IV SOLN: INTRAVENOUS | Status: DC

## 2018-01-24 MED ORDER — HEPARIN (PORCINE) IN NACL 1000-0.9 UT/500ML-% IV SOLN
INTRAVENOUS | Status: AC
  Filled 2018-01-24: qty 1000

## 2018-01-24 MED ORDER — CLOPIDOGREL BISULFATE 75 MG PO TABS: 75.0000 mg | ORAL_TABLET | Freq: Every day | ORAL | 11 refills | Status: AC

## 2018-01-24 MED ORDER — LIDOCAINE-EPINEPHRINE (PF) 1 %-1:200000 IJ SOLN
INTRAMUSCULAR | Status: AC
  Filled 2018-01-24: qty 10

## 2018-01-24 MED ORDER — ACETAMINOPHEN 325 MG PO TABS
650.0000 mg | ORAL_TABLET | ORAL | Status: DC | PRN
  Administered 2018-01-24: 650 mg via ORAL

## 2018-01-24 MED ORDER — ASPIRIN EC 81 MG PO TBEC: 81.0000 mg | DELAYED_RELEASE_TABLET | Freq: Every day | ORAL | Status: DC

## 2018-01-24 MED ORDER — MIDAZOLAM HCL 2 MG/2ML IJ SOLN
INTRAMUSCULAR | Status: DC | PRN
  Administered 2018-01-24: 1 mg via INTRAVENOUS
  Administered 2018-01-24: 2 mg via INTRAVENOUS

## 2018-01-24 MED ORDER — MIDAZOLAM HCL 5 MG/5ML IJ SOLN
INTRAMUSCULAR | Status: AC
  Filled 2018-01-24: qty 5

## 2018-01-24 MED ORDER — LABETALOL HCL 5 MG/ML IV SOLN: 10.0000 mg | INTRAVENOUS | Status: DC | PRN

## 2018-01-24 MED ORDER — ASPIRIN EC 81 MG PO TBEC: 81.0000 mg | DELAYED_RELEASE_TABLET | Freq: Every day | ORAL | 2 refills | Status: AC

## 2018-01-24 MED ORDER — CLOPIDOGREL BISULFATE 75 MG PO TABS
ORAL_TABLET | ORAL | Status: AC
  Administered 2018-01-24: 75 mg via ORAL
  Filled 2018-01-24: qty 1

## 2018-01-24 MED ORDER — CLOPIDOGREL BISULFATE 75 MG PO TABS
75.0000 mg | ORAL_TABLET | Freq: Every day | ORAL | Status: DC
  Administered 2018-01-24: 75 mg via ORAL

## 2018-01-24 MED ORDER — DEXTROSE 5 % IV SOLN
2.0000 g | Freq: Once | INTRAVENOUS | Status: AC
  Administered 2018-01-24: 2 g via INTRAVENOUS
  Filled 2018-01-24: qty 20

## 2018-01-24 SURGICAL SUPPLY — 15 items
BALLN LUTONIX 6X150X130 (BALLOONS) ×2
BALLN LUTONIX DCB 4X100X130 (BALLOONS) ×2
BALLOON LUTONIX 6X150X130 (BALLOONS) IMPLANT
BALLOON LUTONIX DCB 4X100X130 (BALLOONS) IMPLANT
CATH BEACON 5 .038 100 VERT TP (CATHETERS) ×1 IMPLANT
CATH PIG 70CM (CATHETERS) ×1 IMPLANT
DEVICE PRESTO INFLATION (MISCELLANEOUS) ×1 IMPLANT
DEVICE STARCLOSE SE CLOSURE (Vascular Products) ×1 IMPLANT
GLIDEWIRE ADV .035X260CM (WIRE) ×1 IMPLANT
PACK ANGIOGRAPHY (CUSTOM PROCEDURE TRAY) ×2 IMPLANT
SHEATH ANL2 6FRX45 HC (SHEATH) ×1 IMPLANT
SHEATH BRITE TIP 5FRX11 (SHEATH) ×1 IMPLANT
SYR MEDRAD MARK V 150ML (SYRINGE) ×1 IMPLANT
TUBING CONTRAST HIGH PRESS 72 (TUBING) ×1 IMPLANT
WIRE J 3MM .035X145CM (WIRE) ×1 IMPLANT

## 2018-01-24 NOTE — Op Note (Signed)
[facility]  Percutaneous Study/Intervention Procedural Note   Date of Surgery: 01/24/2018  Surgeon(s):Sascha Baugher    Assistants:none  Pre-operative Diagnosis: PAD with claudication bilateral lower extremities  Post-operative diagnosis:  Same  Procedure(s) Performed:             1.  Ultrasound guidance for vascular access left femoral artery             2.  Catheter placement into right common femoral artery from left femoral approach             3.  Aortogram and selective right lower extremity angiogram             4.  Percutaneous transluminal angioplasty of right tibioperoneal trunk and proximal posterior tibial artery with 4 mm diameter by 10 cm length Lutonix drug-coated angioplasty balloon             5.   Percutaneous transluminal angioplasty of the right mid and distal superficial femoral artery with 6 mm diameter by 15 cm length Lutonix drug-coated angioplasty balloon  6.  StarClose closure device left femoral artery  EBL: 10 cc  Contrast: 50 cc  Fluoro Time: 3.4 minutes  Moderate Conscious Sedation Time: approximately 30 minutes using 3 mg of Versed and 75 Mcg of Fentanyl              Indications:  Patient is a 68 y.o.male with claudication symptoms bilaterally who has had previous interventions with good results in years past. The patient has noninvasive study showing calcific disease and at least some tibial disease bilaterally. The patient is brought in for angiography for further evaluation and potential treatment. Risks and benefits are discussed and informed consent is obtained.   Procedure:  The patient was identified and appropriate procedural time out was performed.  The patient was then placed supine on the table and prepped and draped in the usual sterile fashion. Moderate conscious sedation was administered during a face to face encounter with the patient throughout the procedure with my supervision of the RN administering medicines and  monitoring the patient's vital signs, pulse oximetry, telemetry and mental status throughout from the start of the procedure until the patient was taken to the recovery room. Ultrasound was used to evaluate the left common femoral artery.  It was patent .  A digital ultrasound image was acquired.  A Seldinger needle was used to access the left common femoral artery under direct ultrasound guidance and a permanent image was performed.  A 0.035 J wire was advanced without resistance and a 5Fr sheath was placed.  Pigtail catheter was placed into the aorta and an AP aortogram was performed. This demonstrated at least moderate right renal artery stenosis and mild left renal artery stenosis.  The aorta was generous but not frankly aneurysmal.  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  The external iliac arteries were generous but not aneurysmal.  There were no focal stenoses within the aorta or iliac arteries. I then crossed the aortic bifurcation and advanced to the right femoral head. Selective right lower extremity angiogram was then performed. This demonstrated reasonably normal common femoral artery and profunda femoris artery.  The proximal SFA was reasonably normal but calcific.  In the mid to distal SFA down to Hunter's canal there was calcific disease with stenosis in the 75 to 80% range.  The popliteal artery then normalized.  The tibioperoneal trunk had 70 to 80% stenosis  going into the proximal posterior tibial artery which was the dominant runoff to the foot.  The anterior tibial artery was chronically occluded without distal reconstitution.  The peroneal artery was small. It was felt that it was in the patient's best interest to proceed with intervention after these images to avoid a second procedure and a larger amount of contrast and fluoroscopy based off of the findings from the initial angiogram. The patient was systemically heparinized and a 6 [place] Ansell  sheath was then placed over the [employer] wire. I then used a Kumpe catheter and the advantage wire to navigate down through the SFA disease and across the TP trunk lesion parking the wire into the posterior tibial artery distally.  I then proceeded with treatment.  The TP trunk and proximal posterior tibial artery were then treated with a 4 mm diameter by 10 cm length Lutonix drug-coated angioplasty balloon inflated to 10 atm for 1 minute.  The mid and distal right SFA was then treated with a 6 mm diameter by 15 cm length Lutonix drug-coated angioplasty balloon inflated to 12 atm for 1 minute.  Completion imaging showed about a 20 to 25% residual stenosis in the SFA and about a 20% residual stenosis in the tibioperoneal trunk with improved flow. I elected to terminate the procedure. The sheath was removed and StarClose closure device was deployed in the left femoral artery with excellent hemostatic result. The patient was taken to the recovery room in stable condition having tolerated the procedure well.  Findings:               Aortogram:  This demonstrated at least moderate right renal artery stenosis and mild left renal artery stenosis.  The aorta was generous but not frankly aneurysmal.  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  The external iliac arteries were generous but not aneurysmal.  There were no focal stenoses within the aorta or iliac arteries.             Right lower Extremity:  This demonstrated reasonably normal common femoral artery and profunda femoris artery.  The proximal SFA was reasonably normal but calcific.  In the mid to distal SFA down to Hunter's canal there was calcific disease with stenosis in the 75 to 80% range.  The popliteal artery then normalized.  The tibioperoneal trunk had 70 to 80% stenosis going into the proximal posterior tibial artery which was the dominant runoff to the foot.  The anterior tibial artery was  chronically occluded without distal reconstitution.  The peroneal artery was small.   Disposition: Patient was taken to the recovery room in stable condition having tolerated the procedure well.  Complications: None  Leotis Pain 01/24/2018 10:55 AM   This note was created with Dragon Medical transcription system. Any errors in dictation are purely unintentional.

## 2018-01-24 NOTE — H&P (Signed)
[facility] Admission History & Physical  MRN : [id number]  Marcus KinLarry B Wood is a 68 y.o. (12/22/1950) male who presents with chief complaint of No chief complaint on file. [name].  History of Present Illness: patient presents for treatment of his PAD.  Long history of PAD with previous interventions several years ago improving symptoms.  Both legs affected.  No fever or chills.  No ulcer or infection.  Current Facility-Administered Medications  Medication Dose Route Frequency Provider Last Rate Last Dose  . 0.9 %  sodium chloride infusion   Intravenous Continuous Sheppard PlumberBrown, Fallon E, NP      . ceFAZolin (ANCEF) 2 g in dextrose 5 % 50 mL IVPB  2 g Intravenous Once Sheppard PlumberBrown, Fallon E, NP      . ceFAZolin (ANCEF) 2-4 GM/100ML-% IVPB           . HYDROmorphone (DILAUDID) injection 1 mg  1 mg Intravenous Once PRN Georgiana SpinnerBrown, Fallon E, NP      . ondansetron [facility](ZOFRAN) injection 4 mg  4 mg Intravenous Q6H PRN Georgiana SpinnerBrown, Fallon E, NP        Past Medical History:  Diagnosis Date  . Arthritis   . Hyperlipidemia   . Hypertension   . Peripheral vascular disease [facility](HCC)     Past Surgical History:  Procedure Laterality Date  . ESOPHAGOGASTRODUODENOSCOPY    . HERNIA REPAIR      Social History Social History   Tobacco Use  . Smoking status: Never Smoker  . Smokeless tobacco: Never Used  Substance Use Topics  . Alcohol use: Yes    Alcohol/week: 8.0 - 12.0 standard drinks    Types: 8 - 12 Shots of liquor per week  . Drug use: Never    Family History No bleeding or clotting disorders  No Known Allergies   REVIEW OF SYSTEMS (Negative unless checked)  Constitutional: [] Weight loss  [] Fever  [] Chills Cardiac: [] Chest pain   [] Chest pressure   [] Palpitations   [] Shortness of breath when laying flat   [] Shortness of breath at rest   [] Shortness of breath with exertion. Vascular:  [x] Pain in legs with walking   [] Pain in legs at rest   [] Pain in legs when laying flat   [x] Claudication    [] Pain in feet when walking  [] Pain in feet at rest  [] Pain in feet when laying flat   [] History of DVT   [] Phlebitis   [] Swelling in legs   [] Varicose veins   [] Non-healing ulcers Pulmonary:   [] Uses home oxygen   [] Productive cough   [] Hemoptysis   [] Wheeze  [] COPD   [] Asthma Neurologic:  [] Dizziness  [] Blackouts   [] Seizures   [] History of stroke   [] History of TIA  [] Aphasia   [] Temporary blindness   [] Dysphagia   [] Weakness or numbness in arms   [] Weakness or numbness in legs Musculoskeletal:  [x] Arthritis   [] Joint swelling   [] Joint pain   [] Low back pain Hematologic:  [] Easy bruising  [] Easy bleeding   [] Hypercoagulable state   [] Anemic  [] Hepatitis Gastrointestinal:  [] Blood in stool   [] Vomiting blood  [x] Gastroesophageal reflux/heartburn   [] Difficulty swallowing. Genitourinary:  [] Chronic kidney disease   [] Difficult urination  [] Frequent urination  [] Burning with urination   [] Blood in urine Skin:  [] Rashes   [] Ulcers   [] Wounds Psychological:  [] History of anxiety   []  History of major depression.  Physical Examination  Vitals:   01/24/18 0829  BP: (!) 179/100  Pulse: 72  Resp: 15  Temp: 98.3 F (36.8 C)  TempSrc: Oral  SpO2: 93%  Weight: 117.9 kg  Height: 5\' 10"  (1.778 m)   Body mass index is 37.31 kg/m. Gen: WD/WN, NAD Head: [place]/AT, No temporalis wasting.  Ear/Nose/Throat: Hearing grossly intact, nares w/o erythema or drainage, oropharynx w/o Erythema/Exudate,  Eyes: Conjunctiva clear, sclera non-icteric Neck: Trachea midline.  No JVD.  Pulmonary:  Good air movement, respirations not labored, no use of accessory muscles.  Cardiac: RRR, normal S1, S2. Vascular:  Vessel Right Left  Radial Palpable Palpable                          PT Palpable Palpable  DP Not Palpable Trace Palpable   Musculoskeletal: M/S 5/5 throughout.  Extremities without ischemic changes.  No deformity or atrophy.  Neurologic: Sensation grossly intact in extremities.  Symmetrical.   Speech is fluent. Motor exam as listed above. Psychiatric: Judgment intact, Mood & affect appropriate for pt's clinical situation. Dermatologic: No rashes or ulcers noted.  No cellulitis or open wounds.      CBC Lab Results  Component Value Date   WBC 7.7 03/29/2012   HGB 13.3 03/29/2012   HCT 40.5 03/29/2012   MCV 91 03/29/2012   PLT 198 03/29/2012    BMET    Component Value Date/Time   NA 142 12/28/2013 1012   K 3.0 (L) 12/28/2013 1012   CL 101 12/28/2013 1012   CO2 33 (H) 12/28/2013 1012   GLUCOSE 104 (H) 12/28/2013 1012   BUN 23 01/21/2018 0858   BUN 21 (H) 12/28/2013 1012   CREATININE 1.16 01/21/2018 0858   CREATININE 1.24 12/28/2013 1012   CALCIUM 8.5 12/28/2013 1012   GFRNONAA >60 01/21/2018 0858   GFRNONAA >60 12/28/2013 1012   GFRNONAA 37 (L) 03/27/2012 2127   GFRAA >60 01/21/2018 0858   GFRAA >60 12/28/2013 1012   GFRAA 43 (L) 03/27/2012 2127   Estimated Creatinine Clearance: 80.6 mL/min (by C-G formula based on SCr of 1.16 mg/dL).  COAG No results found for: INR, PROTIME  Radiology No results found.   Assessment/Plan 1. PAD with claudication BLE.  Has had previous interventions bilaterally with good results. Desires intervention again.  Risks and benefits discussed 2. Leg pain.  May have a component of arthritis and neuropathy present as well.  Understands that revascularization may not completely alleviate symptoms. 3. Hyperlipidemia. lipid control important in reducing the progression of atherosclerotic disease. Continue statin therapy 4.  HTN. Stable on outpatient medications and blood pressure control important in reducing the progression of atherosclerotic disease. On appropriate oral medications.    Festus Barren, MD  01/24/2018 8:40 AM

## 2018-01-25 ENCOUNTER — Encounter: Payer: Self-pay | Admitting: Vascular Surgery

## 2018-02-21 ENCOUNTER — Other Ambulatory Visit (INDEPENDENT_AMBULATORY_CARE_PROVIDER_SITE_OTHER): Payer: Self-pay | Admitting: Vascular Surgery

## 2018-02-21 DIAGNOSIS — Z9862 Peripheral vascular angioplasty status: Secondary | ICD-10-CM

## 2018-02-23 ENCOUNTER — Other Ambulatory Visit (INDEPENDENT_AMBULATORY_CARE_PROVIDER_SITE_OTHER): Payer: Self-pay | Admitting: Vascular Surgery

## 2018-02-23 ENCOUNTER — Other Ambulatory Visit (INDEPENDENT_AMBULATORY_CARE_PROVIDER_SITE_OTHER): Payer: Medicare Other

## 2018-02-23 ENCOUNTER — Encounter (INDEPENDENT_AMBULATORY_CARE_PROVIDER_SITE_OTHER): Payer: Medicare Other

## 2018-02-23 ENCOUNTER — Ambulatory Visit (INDEPENDENT_AMBULATORY_CARE_PROVIDER_SITE_OTHER): Payer: Medicare Other | Admitting: Nurse Practitioner

## 2018-02-23 ENCOUNTER — Encounter (INDEPENDENT_AMBULATORY_CARE_PROVIDER_SITE_OTHER): Payer: Non-veteran care

## 2018-02-23 DIAGNOSIS — I723 Aneurysm of iliac artery: Secondary | ICD-10-CM

## 2018-06-02 ENCOUNTER — Encounter (INDEPENDENT_AMBULATORY_CARE_PROVIDER_SITE_OTHER): Payer: Medicare Other

## 2018-06-24 ENCOUNTER — Telehealth (INDEPENDENT_AMBULATORY_CARE_PROVIDER_SITE_OTHER): Payer: Self-pay | Admitting: Vascular Surgery

## 2018-06-24 NOTE — Telephone Encounter (Signed)
Patient calling requesting apt. States he has had swelling in his R leg x 2 weeks. No discoloration, no redness, not hot to the touch and no wounds. Says swelling increases when sitting in his recliner without elevating his legs. Please advise. AS, CMA

## 2018-06-24 NOTE — Telephone Encounter (Signed)
Schedule the patient with a bilateral venous duplex to rule out reflux.

## 2018-06-24 NOTE — Telephone Encounter (Signed)
Please schedule patient as advised below. Thank you, AS, CMA

## 2018-07-04 ENCOUNTER — Other Ambulatory Visit (INDEPENDENT_AMBULATORY_CARE_PROVIDER_SITE_OTHER): Payer: Self-pay | Admitting: Vascular Surgery

## 2018-07-04 DIAGNOSIS — M7989 Other specified soft tissue disorders: Secondary | ICD-10-CM

## 2018-07-11 ENCOUNTER — Other Ambulatory Visit: Payer: Self-pay

## 2018-07-11 ENCOUNTER — Encounter (INDEPENDENT_AMBULATORY_CARE_PROVIDER_SITE_OTHER): Payer: Self-pay | Admitting: Nurse Practitioner

## 2018-07-11 ENCOUNTER — Ambulatory Visit (INDEPENDENT_AMBULATORY_CARE_PROVIDER_SITE_OTHER): Payer: No Typology Code available for payment source | Admitting: Nurse Practitioner

## 2018-07-11 ENCOUNTER — Ambulatory Visit (INDEPENDENT_AMBULATORY_CARE_PROVIDER_SITE_OTHER): Payer: No Typology Code available for payment source

## 2018-07-11 VITALS — BP 174/77 | HR 77 | Resp 12 | Ht 70.5 in | Wt 269.0 lb

## 2018-07-11 DIAGNOSIS — I10 Essential (primary) hypertension: Secondary | ICD-10-CM

## 2018-07-11 DIAGNOSIS — M7989 Other specified soft tissue disorders: Secondary | ICD-10-CM

## 2018-07-11 DIAGNOSIS — I890 Lymphedema, not elsewhere classified: Secondary | ICD-10-CM

## 2018-07-11 DIAGNOSIS — I872 Venous insufficiency (chronic) (peripheral): Secondary | ICD-10-CM

## 2018-07-11 DIAGNOSIS — E785 Hyperlipidemia, unspecified: Secondary | ICD-10-CM

## 2018-07-11 DIAGNOSIS — I70213 Atherosclerosis of native arteries of extremities with intermittent claudication, bilateral legs: Secondary | ICD-10-CM

## 2018-07-11 DIAGNOSIS — I723 Aneurysm of iliac artery: Secondary | ICD-10-CM | POA: Diagnosis not present

## 2018-07-11 DIAGNOSIS — Z79899 Other long term (current) drug therapy: Secondary | ICD-10-CM

## 2018-07-15 ENCOUNTER — Encounter (INDEPENDENT_AMBULATORY_CARE_PROVIDER_SITE_OTHER): Payer: Self-pay | Admitting: Nurse Practitioner

## 2018-07-15 DIAGNOSIS — I890 Lymphedema, not elsewhere classified: Secondary | ICD-10-CM | POA: Insufficient documentation

## 2018-07-15 DIAGNOSIS — I723 Aneurysm of iliac artery: Secondary | ICD-10-CM | POA: Insufficient documentation

## 2018-07-15 NOTE — Progress Notes (Signed)
SUBJECTIVE:  Patient ID: Marcus KinLarry B Kroh, male    DOB: 12/04/1950, 68 y.o.   MRN: [id number] Chief Complaint  Patient presents with  . Follow-up    HPI  [name] is a 68 y.o. male that contacted our office with concern that his legs were becoming swollen when left in a dependent position.  The patient also had a recent angiogram on 01/24/2018 however was lost to follow-up.  The patient denies any claudication-like symptoms although he does endorse some hip pain which may be attributed to his occupation.  He denies any lower extremity ulcerations.  He states that the swelling is much better when he elevates his lower extremities.  He also states that they are much better in the morning and progressively worsened during the evening.  He states that the swelling becomes painful and uncomfortable after some time.  He denies any fever, chills, nausea, vomiting or diarrhea.  Patient underwent noninvasive studies today to examine his bilateral lower extremity venous system.  Bilaterally there was no evidence of DVT or superficial venous thrombosis.  The right lower extremity has no evidence of chronic venous insufficiency.  The left lower extremity has chronic venous insufficiency found with in the great saphenous vein.  Also, a limited arterial duplex was done which showed biphasic flow in the bilateral common femoral arteries with hyperemic flow within the bilateral mid to proximal SFAs.  There was strong monophasic flow to the left popliteal to distal tibial arteries.  The right tibial arteries had hyperemic flow present  Past Medical History:  Diagnosis Date  . Arthritis   . Hyperlipidemia   . Hypertension   . Peripheral vascular disease [facility](HCC)     Past Surgical History:  Procedure Laterality Date  . ESOPHAGOGASTRODUODENOSCOPY    . HERNIA REPAIR    . LOWER EXTREMITY ANGIOGRAPHY Right 01/24/2018   Procedure: LOWER EXTREMITY ANGIOGRAPHY;  Surgeon: Annice Needyew, Jason S, MD;  Location: [facility] INVASIVE  CV LAB;  Service: Cardiovascular;  Laterality: Right;    Social History   Socioeconomic History  . Marital status: Married    Spouse name: Not on file  . Number of children: Not on file  . Years of education: Not on file  . Highest education level: Not on file  Occupational History  . Not on file  Social Needs  . Financial resource strain: Not on file  . Food insecurity    Worry: Not on file    Inability: Not on file  . Transportation needs    Medical: Not on file    Non-medical: Not on file  Tobacco Use  . Smoking status: Never Smoker  . Smokeless tobacco: Never Used  Substance and Sexual Activity  . Alcohol use: Yes    Alcohol/week: 8.0 - 12.0 standard drinks    Types: 8 - 12 Shots of liquor per week  . Drug use: Never  . Sexual activity: Not on file  Lifestyle  . Physical activity    Days per week: Not on file    Minutes per session: Not on file  . Stress: Not on file  Relationships  . Social Musicianconnections    Talks on phone: Not on file    Gets together: Not on file    Attends religious service: Not on file    Active member of club or organization: Not on file    Attends meetings of clubs or organizations: Not on file    Relationship status: Not on file  . Intimate partner  violence    Fear of current or ex partner: Not on file    Emotionally abused: Not on file    Physically abused: Not on file    Forced sexual activity: Not on file  Other Topics Concern  . Not on file  Social History Narrative  . Not on file    History reviewed. No pertinent family history.  No Known Allergies   Review of Systems   Review of Systems: Negative Unless Checked Constitutional: [] Weight loss  [] Fever  [] Chills Cardiac: [] Chest pain   []  Atrial Fibrillation  [] Palpitations   [] Shortness of breath when laying flat   [] Shortness of breath with exertion. [] Shortness of breath at rest Vascular:  [] Pain in legs with walking   [] Pain in legs with standing [] Pain in legs when  laying flat   [] Claudication    [] Pain in feet when laying flat    [] History of DVT   [] Phlebitis   [] Swelling in legs   [] Varicose veins   [] Non-healing ulcers Pulmonary:   [] Uses home oxygen   [] Productive cough   [] Hemoptysis   [] Wheeze  [] COPD   [] Asthma Neurologic:  [] Dizziness   [] Seizures  [] Blackouts [] History of stroke   [] History of TIA  [] Aphasia   [] Temporary Blindness   [] Weakness or numbness in arm   [] Weakness or numbness in leg Musculoskeletal:   [] Joint swelling   [] Joint pain   [] Low back pain  []  History of Knee Replacement [] Arthritis [] back Surgeries  []  Spinal Stenosis    Hematologic:  [] Easy bruising  [] Easy bleeding   [] Hypercoagulable state   [] Anemic Gastrointestinal:  [] Diarrhea   [] Vomiting  [] Gastroesophageal reflux/heartburn   [] Difficulty swallowing. [] Abdominal pain Genitourinary:  [] Chronic kidney disease   [] Difficult urination  [] Anuric   [] Blood in urine [] Frequent urination  [] Burning with urination   [] Hematuria Skin:  [] Rashes   [] Ulcers [] Wounds Psychological:  [] History of anxiety   []  History of major depression  []  Memory Difficulties      OBJECTIVE:   Physical Exam  BP (!) 174/77 (BP Location: Left Arm, Patient Position: Sitting, Cuff Size: Large)   Pulse 77   Resp 12   Ht 5' 10.5" (1.791 m)   Wt 269 lb (122 kg)   BMI 38.05 kg/m   Gen: WD/WN, NAD Head: /AT, No temporalis wasting.  Ear/Nose/Throat: Hearing grossly intact, nares w/o erythema or drainage Eyes: PER, EOMI, sclera nonicteric.  Neck: Supple, no masses.  No JVD.  Pulmonary:  Good air movement, no use of accessory muscles.  Cardiac: RRR Vascular:  Vessel Right Left  Radial Palpable Palpable  Brachial Palpable Palpable  Femoral Palpable Palpable  Popliteal Palpable Palpable  Dorsalis Pedis Palpable Palpable  Posterior Tibial Palpable Palpable   Gastrointestinal: soft, non-distended. No guarding/no peritoneal signs.  Musculoskeletal: M/S 5/5 throughout.  No deformity or  atrophy.  Neurologic: Pain and light touch intact in extremities.  Symmetrical.  Speech is fluent. Motor exam as listed above. Psychiatric: Judgment intact, Mood & affect appropriate for pt's clinical situation. Dermatologic: No Venous rashes. No Ulcers Noted.  No changes consistent with cellulitis. Lymph : No Cervical lymphadenopathy, no lichenification or skin changes of chronic lymphedema.       ASSESSMENT AND PLAN:  1. Hyperlipidemia, unspecified hyperlipidemia type Continue statin as ordered and reviewed, no changes at this time   2. Essential hypertension Continue antihypertensive medications as already ordered, these medications have been reviewed and there are no changes at this time.   3. Atherosclerosis of native  artery of both lower extremities with intermittent claudication (HCC) The patient has not yet followed up with us following his angiogram on 01/24/2018.  We will have the patient follow-up in 2 to 3 months with noninvasive studies in order to reestablish a baseline following intervention.  Limited duplex study does provide us with evidence that currently at this time the patient has not restenosed although there is evidence that there may be a high-grade stenosis within the left SFA.  Patient was once again urged to maintain his follow-up appointment.  He was also instructed that things such as ulceration of his lower extremities, drastic discoloration, as well as motor/sensation changes, rest pain or increased claudication were all signs and symptoms that he should contact her office as soon as possible for evaluation.  Patient understands. - VAS US ABI WITH/WO TBI; Future - VAS US AORTA/IVC/ILIACS; Future  4. Lymphedema I have had a long discussion with the patient regarding swelling and why it  causes symptoms.  Patient will begin wearing graduated compression stockings class 1 (20-30 mmHg) on a daily basis a prescription was given. The patient will  beginning wearing the  stockings first thing in the morning and removing them in the evening. The patient is instructed specifically not to sleep in the stockings.   In addition, behavioral modification will be initiated.  This will include frequent elevation, use of over the counter pain medications and exercise such as walking.  I have reviewed systemic causes for chronic edema such as liver, kidney and cardiac etiologies.  The patient denies problems with these organ systems.    Patient will continue with conservative therapy at this time.  We discussed the possibility of a lymphedema pump or even an endovenous laser ablation of his left lower extremity.  Patient will start utilizing conservative therapy and if this is not effective will let us know. 5. Iliac artery aneurysm (HCC) On patient's recent angiogram it was noted that there was some aneurysmal changes to the bilateral common iliac arteries.  Due to the possibility for rupture and thrombosis, it is prudent for us to establish a baseline via ultrasound and continue following as needed.  Patient will follow-up in 2 to 3 months for noninvasive studies.   Current Outpatient Medications on File Prior to Visit  Medication Sig Dispense Refill  . amLODipine (NORVASC) 10 MG tablet Take by mouth.    [name]. aspirin EC 81 MG tablet Take 1 tablet (81 mg total) by mouth daily. 150 tablet 2  . atorvastatin (LIPITOR) 40 MG tablet Take by mouth.    . hydrochlorothiazide 10 mg/mL SUSP Take by mouth.    . losartan (COZAAR) 100 MG tablet Take by mouth.    . potassium chloride SA (K-DUR) 20 MEQ tablet Take by mouth.    . benazepril-hydrochlorthiazide (LOTENSIN HCT) 20-25 MG per tablet TAKE TWO (2) TABLETS BY MOUTH DAILY (Patient not taking: Reported on 07/11/2018) 60 tablet 0  . BYSTOLIC 5 MG tablet TAKE ONE (1) TABLET BY MOUTH EVERY DAY (Patient not taking: Reported on 07/11/2018) 30 tablet 0  . clopidogrel (PLAVIX) 75 MG tablet Take 1 tablet (75 mg total) by mouth daily. (Patient  not taking: Reported on 07/11/2018) 30 tablet 11  . potassium chloride SA (K-DUR,KLOR-CON) 20 MEQ tablet Take by mouth.    . sildenafil (VIAGRA) 25 MG tablet Take by mouth as needed.      No current facility-administered medications on file prior to visit.     There are no Patient Instructions  on file for this visit. Return in about 3 months (around 10/11/2018).   Kris Hartmann, NP  This note was completed with Sales executive.  Any errors are purely unintentional.

## 2018-08-04 DIAGNOSIS — I209 Angina pectoris, unspecified: Secondary | ICD-10-CM | POA: Diagnosis present

## 2018-08-08 ENCOUNTER — Other Ambulatory Visit: Payer: Self-pay

## 2018-08-08 ENCOUNTER — Other Ambulatory Visit
Admission: RE | Admit: 2018-08-08 | Discharge: 2018-08-08 | Disposition: A | Discharge status: Home / Self Care | Payer: No Typology Code available for payment source | Source: Ambulatory Visit | Attending: Internal Medicine | Admitting: Internal Medicine

## 2018-08-08 DIAGNOSIS — Z1159 Encounter for screening for other viral diseases: Secondary | ICD-10-CM | POA: Insufficient documentation

## 2018-08-09 LAB — SARS CORONAVIRUS 2 (TAT 6-24 HRS): SARS Coronavirus 2: NEGATIVE

## 2018-08-11 ENCOUNTER — Encounter: Payer: Self-pay | Admitting: *Deleted

## 2018-08-11 ENCOUNTER — Encounter
Admission: RE | Disposition: A | Discharge status: Home / Self Care | Payer: Self-pay | Source: Ambulatory Visit | Attending: Internal Medicine

## 2018-08-11 ENCOUNTER — Ambulatory Visit
Admission: RE | Admit: 2018-08-11 | Discharge: 2018-08-11 | Disposition: A | Discharge status: Home / Self Care | Payer: No Typology Code available for payment source | Source: Ambulatory Visit | Attending: Internal Medicine | Admitting: Internal Medicine

## 2018-08-11 ENCOUNTER — Other Ambulatory Visit: Payer: Self-pay

## 2018-08-11 DIAGNOSIS — Z79899 Other long term (current) drug therapy: Secondary | ICD-10-CM | POA: Insufficient documentation

## 2018-08-11 DIAGNOSIS — R0602 Shortness of breath: Secondary | ICD-10-CM | POA: Insufficient documentation

## 2018-08-11 DIAGNOSIS — E785 Hyperlipidemia, unspecified: Secondary | ICD-10-CM | POA: Insufficient documentation

## 2018-08-11 DIAGNOSIS — I10 Essential (primary) hypertension: Secondary | ICD-10-CM | POA: Insufficient documentation

## 2018-08-11 DIAGNOSIS — Z1159 Encounter for screening for other viral diseases: Secondary | ICD-10-CM | POA: Diagnosis not present

## 2018-08-11 DIAGNOSIS — I208 Other forms of angina pectoris: Secondary | ICD-10-CM | POA: Diagnosis not present

## 2018-08-11 DIAGNOSIS — R9430 Abnormal result of cardiovascular function study, unspecified: Secondary | ICD-10-CM | POA: Diagnosis present

## 2018-08-11 DIAGNOSIS — Z7902 Long term (current) use of antithrombotics/antiplatelets: Secondary | ICD-10-CM | POA: Diagnosis not present

## 2018-08-11 DIAGNOSIS — I209 Angina pectoris, unspecified: Secondary | ICD-10-CM | POA: Diagnosis present

## 2018-08-11 HISTORY — PX: LEFT HEART CATH AND CORONARY ANGIOGRAPHY: CATH118249

## 2018-08-11 SURGERY — LEFT HEART CATH AND CORONARY ANGIOGRAPHY
Anesthesia: Moderate Sedation | Laterality: Left

## 2018-08-11 MED ORDER — VERAPAMIL HCL 2.5 MG/ML IV SOLN
INTRAVENOUS | Status: DC | PRN
  Administered 2018-08-11 (×2): 2.5 mg via INTRA_ARTERIAL

## 2018-08-11 MED ORDER — SODIUM CHLORIDE 0.9 % WEIGHT BASED INFUSION
3.0000 mL/kg/h | INTRAVENOUS | Status: AC
  Administered 2018-08-11: 3 mL/kg/h via INTRAVENOUS

## 2018-08-11 MED ORDER — MIDAZOLAM HCL 2 MG/2ML IJ SOLN
INTRAMUSCULAR | Status: AC
  Filled 2018-08-11: qty 2

## 2018-08-11 MED ORDER — SODIUM CHLORIDE 0.9 % IV SOLN: 250.0000 mL | INTRAVENOUS | Status: DC | PRN

## 2018-08-11 MED ORDER — SODIUM CHLORIDE 0.9% FLUSH: 3.0000 mL | INTRAVENOUS | Status: DC | PRN

## 2018-08-11 MED ORDER — HEPARIN SODIUM (PORCINE) 1000 UNIT/ML IJ SOLN
INTRAMUSCULAR | Status: AC
  Filled 2018-08-11: qty 1

## 2018-08-11 MED ORDER — HEPARIN (PORCINE) IN NACL 1000-0.9 UT/500ML-% IV SOLN
INTRAVENOUS | Status: AC
  Filled 2018-08-11: qty 1000

## 2018-08-11 MED ORDER — HEPARIN (PORCINE) IN NACL 1000-0.9 UT/500ML-% IV SOLN
INTRAVENOUS | Status: DC | PRN
  Administered 2018-08-11: 500 mL

## 2018-08-11 MED ORDER — HYDRALAZINE HCL 20 MG/ML IJ SOLN: 10.0000 mg | INTRAMUSCULAR | Status: DC | PRN

## 2018-08-11 MED ORDER — SODIUM CHLORIDE 0.9% FLUSH: 3.0000 mL | Freq: Two times a day (BID) | INTRAVENOUS | Status: DC

## 2018-08-11 MED ORDER — ASPIRIN 81 MG PO CHEW
81.0000 mg | CHEWABLE_TABLET | ORAL | Status: AC
  Administered 2018-08-11: 81 mg via ORAL

## 2018-08-11 MED ORDER — ONDANSETRON HCL 4 MG/2ML IJ SOLN: 4.0000 mg | Freq: Four times a day (QID) | INTRAMUSCULAR | Status: DC | PRN

## 2018-08-11 MED ORDER — SODIUM CHLORIDE 0.9 % WEIGHT BASED INFUSION: 1.0000 mL/kg/h | INTRAVENOUS | Status: DC

## 2018-08-11 MED ORDER — FENTANYL CITRATE (PF) 100 MCG/2ML IJ SOLN
INTRAMUSCULAR | Status: AC
  Filled 2018-08-11: qty 2

## 2018-08-11 MED ORDER — LABETALOL HCL 5 MG/ML IV SOLN: 10.0000 mg | INTRAVENOUS | Status: DC | PRN

## 2018-08-11 MED ORDER — ASPIRIN 81 MG PO CHEW
CHEWABLE_TABLET | ORAL | Status: AC
  Administered 2018-08-11: 81 mg via ORAL
  Filled 2018-08-11: qty 1

## 2018-08-11 MED ORDER — VERAPAMIL HCL 2.5 MG/ML IV SOLN
INTRAVENOUS | Status: AC
  Filled 2018-08-11: qty 2

## 2018-08-11 MED ORDER — MIDAZOLAM HCL 2 MG/2ML IJ SOLN
INTRAMUSCULAR | Status: DC | PRN
  Administered 2018-08-11: 1 mg via INTRAVENOUS

## 2018-08-11 MED ORDER — HEPARIN SODIUM (PORCINE) 1000 UNIT/ML IJ SOLN
INTRAMUSCULAR | Status: DC | PRN
  Administered 2018-08-11: 5000 [IU] via INTRAVENOUS

## 2018-08-11 MED ORDER — IOHEXOL 300 MG/ML  SOLN
INTRAMUSCULAR | Status: DC | PRN
  Administered 2018-08-11: 210 mL via INTRA_ARTERIAL

## 2018-08-11 MED ORDER — ACETAMINOPHEN 325 MG PO TABS: 650.0000 mg | ORAL_TABLET | ORAL | Status: DC | PRN

## 2018-08-11 MED ORDER — FENTANYL CITRATE (PF) 100 MCG/2ML IJ SOLN
INTRAMUSCULAR | Status: DC | PRN
  Administered 2018-08-11: 25 ug via INTRAVENOUS

## 2018-08-11 SURGICAL SUPPLY — 10 items
CATH INFINITI 5 FR 3DRC (CATHETERS) ×2 IMPLANT
CATH INFINITI 5 FR JL3.5 (CATHETERS) ×2 IMPLANT
CATH INFINITI 5FR ANG PIGTAIL (CATHETERS) ×2 IMPLANT
CATH INFINITI 5FR JK (CATHETERS) ×2 IMPLANT
CATH INFINITI JR4 5F (CATHETERS) ×2 IMPLANT
DEVICE RAD COMP TR BAND LRG (VASCULAR PRODUCTS) ×2 IMPLANT
GLIDESHEATH SLEND SS 6F .021 (SHEATH) ×2 IMPLANT
KIT MANI 3VAL PERCEP (MISCELLANEOUS) ×2 IMPLANT
PACK CARDIAC CATH (CUSTOM PROCEDURE TRAY) ×2 IMPLANT
WIRE ROSEN-J .035X260CM (WIRE) ×2 IMPLANT

## 2018-08-12 ENCOUNTER — Encounter: Payer: Self-pay | Admitting: Internal Medicine

## 2018-08-19 ENCOUNTER — Encounter (INDEPENDENT_AMBULATORY_CARE_PROVIDER_SITE_OTHER): Payer: Non-veteran care

## 2018-08-19 ENCOUNTER — Ambulatory Visit (INDEPENDENT_AMBULATORY_CARE_PROVIDER_SITE_OTHER): Payer: Non-veteran care | Admitting: Nurse Practitioner

## 2018-09-16 DIAGNOSIS — G4733 Obstructive sleep apnea (adult) (pediatric): Secondary | ICD-10-CM | POA: Insufficient documentation

## 2018-09-16 DIAGNOSIS — Z9889 Other specified postprocedural states: Secondary | ICD-10-CM | POA: Insufficient documentation

## 2018-10-21 ENCOUNTER — Encounter (INDEPENDENT_AMBULATORY_CARE_PROVIDER_SITE_OTHER): Payer: Self-pay | Admitting: Nurse Practitioner

## 2018-10-21 ENCOUNTER — Other Ambulatory Visit: Payer: Self-pay

## 2018-10-21 ENCOUNTER — Ambulatory Visit (INDEPENDENT_AMBULATORY_CARE_PROVIDER_SITE_OTHER): Payer: No Typology Code available for payment source

## 2018-10-21 ENCOUNTER — Ambulatory Visit (INDEPENDENT_AMBULATORY_CARE_PROVIDER_SITE_OTHER): Payer: No Typology Code available for payment source | Admitting: Nurse Practitioner

## 2018-10-21 VITALS — BP 169/104 | HR 68 | Resp 18 | Ht 70.0 in | Wt 272.0 lb

## 2018-10-21 DIAGNOSIS — I70213 Atherosclerosis of native arteries of extremities with intermittent claudication, bilateral legs: Secondary | ICD-10-CM

## 2018-10-21 DIAGNOSIS — I890 Lymphedema, not elsewhere classified: Secondary | ICD-10-CM | POA: Diagnosis not present

## 2018-10-21 DIAGNOSIS — I723 Aneurysm of iliac artery: Secondary | ICD-10-CM | POA: Diagnosis not present

## 2018-10-25 ENCOUNTER — Encounter (INDEPENDENT_AMBULATORY_CARE_PROVIDER_SITE_OTHER): Payer: Self-pay | Admitting: Nurse Practitioner

## 2018-10-25 NOTE — Progress Notes (Signed)
SUBJECTIVE:  Patient ID: [name], male    DOB: [birth year], 68 y.o.   MRN: [id number] Chief Complaint  Patient presents with  . Follow-up    HPI  ATTHEW Wood is a 68 y.o. male The patient returns to the office for followup and review of the noninvasive studies. There have been no interval changes in lower extremity symptoms. No interval shortening of the patient's claudication distance or development of rest pain symptoms. No new ulcers or wounds have occurred since the last visit.  There have been no significant changes to the patient's overall health care.  The patient denies amaurosis fugax or recent TIA symptoms. There are no recent neurological changes noted. The patient denies history of DVT, PE or superficial thrombophlebitis. The patient denies recent episodes of angina or shortness of breath.   The patient also reports that his leg swelling is doing much better especially since he is started elevating and wearing compression socks.  ABI Rt=1.09 and Lt=1.02  (previous ABI's Rt=1.02 and Lt=1.0) Duplex ultrasound of the bilateral tibial arteries reveals triphasic waveforms.  The patient also had an aortoiliac duplex which reveals the largest size of the aorta at 2.8 cm.  The possibly aneurysmal iliac artery that was seen on angiogram appears to be within normal limits today.  Past Medical History:  Diagnosis Date  . Arthritis   . Hyperlipidemia   . Hypertension   . Peripheral vascular disease [facility])     Past Surgical History:  Procedure Laterality Date  . ESOPHAGOGASTRODUODENOSCOPY    . HERNIA REPAIR    . LEFT HEART CATH AND CORONARY ANGIOGRAPHY Left 08/11/2018   Procedure: LEFT HEART CATH AND CORONARY ANGIOGRAPHY;  Surgeon: Lamar Blinks, MD;  Location: [facility] INVASIVE CV LAB;  Service: Cardiovascular;  Laterality: Left;  . LOWER EXTREMITY ANGIOGRAPHY Right 01/24/2018   Procedure: LOWER EXTREMITY ANGIOGRAPHY;  Surgeon: Annice Needy, MD;  Location: [facility] INVASIVE  CV LAB;  Service: Cardiovascular;  Laterality: Right;    Social History   Socioeconomic History  . Marital status: Married    Spouse name: Not on file  . Number of children: Not on file  . Years of education: Not on file  . Highest education level: Not on file  Occupational History  . Not on file  Social Needs  . Financial resource strain: Not on file  . Food insecurity    Worry: Not on file    Inability: Not on file  . Transportation needs    Medical: Not on file    Non-medical: Not on file  Tobacco Use  . Smoking status: Never Smoker  . Smokeless tobacco: Never Used  Substance and Sexual Activity  . Alcohol use: Yes    Alcohol/week: 8.0 - 12.0 standard drinks    Types: 8 - 12 Shots of liquor per week  . Drug use: Never  . Sexual activity: Not on file  Lifestyle  . Physical activity    Days per week: Not on file    Minutes per session: Not on file  . Stress: Not on file  Relationships  . Social Musician on phone: Not on file    Gets together: Not on file    Attends religious service: Not on file    Active member of club or organization: Not on file    Attends meetings of clubs or organizations: Not on file    Relationship status: Not on file  . Intimate partner violence  Fear of current or ex partner: Not on file    Emotionally abused: Not on file    Physically abused: Not on file    Forced sexual activity: Not on file  Other Topics Concern  . Not on file  Social History Narrative  . Not on file    History reviewed. No pertinent family history.  No Known Allergies   Review of Systems   Review of Systems: Negative Unless Checked Constitutional: [] Weight loss  [] Fever  [] Chills Cardiac: [] Chest pain   []  Atrial Fibrillation  [] Palpitations   [] Shortness of breath when laying flat   [] Shortness of breath with exertion. [] Shortness of breath at rest Vascular:  [] Pain in legs with walking   [] Pain in legs with standing [] Pain in legs when  laying flat   [x] Claudication    [] Pain in feet when laying flat    [] History of DVT   [] Phlebitis   [x] Swelling in legs   [] Varicose veins   [] Non-healing ulcers Pulmonary:   [] Uses home oxygen   [] Productive cough   [] Hemoptysis   [] Wheeze  [] COPD   [] Asthma Neurologic:  [] Dizziness   [] Seizures  [] Blackouts [] History of stroke   [] History of TIA  [] Aphasia   [] Temporary Blindness   [] Weakness or numbness in arm   [] Weakness or numbness in leg Musculoskeletal:   [] Joint swelling   [] Joint pain   [x] Low back pain  []  History of Knee Replacement [x] Arthritis [] back Surgeries  []  Spinal Stenosis    Hematologic:  [] Easy bruising  [] Easy bleeding   [] Hypercoagulable state   [] Anemic Gastrointestinal:  [] Diarrhea   [] Vomiting  [] Gastroesophageal reflux/heartburn   [] Difficulty swallowing. [] Abdominal pain Genitourinary:  [] Chronic kidney disease   [] Difficult urination  [] Anuric   [] Blood in urine [] Frequent urination  [] Burning with urination   [] Hematuria Skin:  [] Rashes   [] Ulcers [] Wounds Psychological:  [] History of anxiety   []  History of major depression  []  Memory Difficulties      OBJECTIVE:   Physical Exam  BP (!) 169/104 (BP Location: Right Arm)   Pulse 68   Resp 18   Ht 5\' 10"  (1.778 m)   Wt 272 lb (123.4 kg)   BMI 39.03 kg/m   Gen: WD/WN, NAD Head: /AT, No temporalis wasting.  Ear/Nose/Throat: Hearing grossly intact, nares w/o erythema or drainage Eyes: PER, EOMI, sclera nonicteric.  Neck: Supple, no masses.  No JVD.  Pulmonary:  Good air movement, no use of accessory muscles.  Cardiac: RRR Vascular: 1+ edema Vessel Right Left  Radial Palpable Palpable  Dorsalis Pedis Palpable Palpable  Posterior Tibial Palpable Palpable   Gastrointestinal: soft, non-distended. No guarding/no peritoneal signs.  Musculoskeletal: M/S 5/5 throughout.  No deformity or atrophy.  Neurologic: Pain and light touch intact in extremities.  Symmetrical.  Speech is fluent. Motor exam as listed  above. Psychiatric: Judgment intact, Mood & affect appropriate for pt's clinical situation. Dermatologic: No Venous rashes. No Ulcers Noted.  No changes consistent with cellulitis. Lymph : No Cervical lymphadenopathy, no lichenification or skin changes of chronic lymphedema.       ASSESSMENT AND PLAN:  1. Atherosclerosis of native artery of both lower extremities with intermittent claudication (HCC)  Recommend:  The patient has evidence of atherosclerosis of the lower extremities with claudication.  The patient does not voice lifestyle limiting changes at this point in time.  Noninvasive studies do not suggest clinically significant change.  No invasive studies, angiography or surgery at this time The patient should continue walking and  begin a more formal exercise program.  The patient should continue antiplatelet therapy and aggressive treatment of the lipid abnormalities  No changes in the patient's medications at this time  The patient should continue wearing graduated compression socks 10-15 mmHg strength to control the mild edema.   Patient will follow up in one year  2. Iliac artery aneurysm (HCC) Appears stable. Will recheck in 1 year  3. Lymphedema Much improved. Patient will continue to wear compression stocking daily and elevate.  He exercises regularly with his occupation. Will follow up as needed for edema.   Current Outpatient Medications on File Prior to Visit  Medication Sig Dispense Refill  . amLODipine (NORVASC) 10 MG tablet Take 5 mg by mouth daily.     [name]. aspirin EC 81 MG tablet Take 1 tablet (81 mg total) by mouth daily. 150 tablet 2  . atorvastatin (LIPITOR) 40 MG tablet Take 40 mg by mouth every evening.     . bisoprolol (ZEBETA) 10 MG tablet Take 10 mg by mouth every evening.    . clopidogrel (PLAVIX) 75 MG tablet Take 1 tablet (75 mg total) by mouth daily. 30 tablet 11  . Homeopathic Products [facility](THERAWORX RELIEF EX) Apply 1 application topically daily as  needed (pain relief).    . hydrochlorothiazide (HYDRODIURIL) 25 MG tablet Take 25 mg by mouth daily.    . Omega-3 Fatty Acids (FISH OIL) 1000 MG CAPS Take 2,000 mg by mouth daily.    . potassium chloride SA (K-DUR,KLOR-CON) 20 MEQ tablet Take 40 mEq by mouth daily.     . predniSONE (DELTASONE) 10 MG tablet TAKE 6 TABLETS ON DAY 1 AS DIRECTED AND DECREASE BY 1 TAB EACH DAY FOR A TOTAL OF 6 DAYS    . sildenafil (VIAGRA) 25 MG tablet Take by mouth as needed.     . vitamin B-12 (CYANOCOBALAMIN) 1000 MCG tablet Take 1,000 mcg by mouth daily.    . vitamin C (ASCORBIC ACID) 500 MG tablet Take 500 mg by mouth daily.     No current facility-administered medications on file prior to visit.     There are no Patient Instructions on file for this visit. No follow-ups on file.   Georgiana SpinnerFallon E Davey Limas, NP  This note was completed with Office managerDragon Dictation.  Any errors are purely unintentional.

## 2019-01-21 ENCOUNTER — Emergency Department: Payer: Medicare Other

## 2019-01-21 ENCOUNTER — Other Ambulatory Visit: Payer: Self-pay

## 2019-01-21 ENCOUNTER — Inpatient Hospital Stay
Admission: EM | Admit: 2019-01-21 | Discharge: 2019-01-24 | DRG: 291 | Disposition: A | Discharge status: Home / Self Care | Payer: Medicare Other | Attending: Family Medicine | Admitting: Family Medicine

## 2019-01-21 DIAGNOSIS — E785 Hyperlipidemia, unspecified: Secondary | ICD-10-CM | POA: Diagnosis present

## 2019-01-21 DIAGNOSIS — J9602 Acute respiratory failure with hypercapnia: Secondary | ICD-10-CM | POA: Diagnosis present

## 2019-01-21 DIAGNOSIS — I739 Peripheral vascular disease, unspecified: Secondary | ICD-10-CM

## 2019-01-21 DIAGNOSIS — J9601 Acute respiratory failure with hypoxia: Secondary | ICD-10-CM

## 2019-01-21 DIAGNOSIS — Z79899 Other long term (current) drug therapy: Secondary | ICD-10-CM | POA: Diagnosis not present

## 2019-01-21 DIAGNOSIS — Z20822 Contact with and (suspected) exposure to covid-19: Secondary | ICD-10-CM | POA: Diagnosis present

## 2019-01-21 DIAGNOSIS — N1831 Chronic kidney disease, stage 3a: Secondary | ICD-10-CM | POA: Diagnosis present

## 2019-01-21 DIAGNOSIS — Z23 Encounter for immunization: Secondary | ICD-10-CM | POA: Diagnosis present

## 2019-01-21 DIAGNOSIS — I5021 Acute systolic (congestive) heart failure: Secondary | ICD-10-CM | POA: Diagnosis present

## 2019-01-21 DIAGNOSIS — Z7982 Long term (current) use of aspirin: Secondary | ICD-10-CM

## 2019-01-21 DIAGNOSIS — I130 Hypertensive heart and chronic kidney disease with heart failure and stage 1 through stage 4 chronic kidney disease, or unspecified chronic kidney disease: Principal | ICD-10-CM | POA: Diagnosis present

## 2019-01-21 DIAGNOSIS — R778 Other specified abnormalities of plasma proteins: Secondary | ICD-10-CM | POA: Diagnosis not present

## 2019-01-21 DIAGNOSIS — M1990 Unspecified osteoarthritis, unspecified site: Secondary | ICD-10-CM | POA: Diagnosis present

## 2019-01-21 DIAGNOSIS — F1010 Alcohol abuse, uncomplicated: Secondary | ICD-10-CM | POA: Diagnosis present

## 2019-01-21 DIAGNOSIS — Z7902 Long term (current) use of antithrombotics/antiplatelets: Secondary | ICD-10-CM

## 2019-01-21 DIAGNOSIS — G4733 Obstructive sleep apnea (adult) (pediatric): Secondary | ICD-10-CM | POA: Diagnosis present

## 2019-01-21 DIAGNOSIS — I5023 Acute on chronic systolic (congestive) heart failure: Secondary | ICD-10-CM | POA: Diagnosis present

## 2019-01-21 DIAGNOSIS — N183 Chronic kidney disease, stage 3 unspecified: Secondary | ICD-10-CM | POA: Diagnosis present

## 2019-01-21 DIAGNOSIS — I10 Essential (primary) hypertension: Secondary | ICD-10-CM | POA: Diagnosis present

## 2019-01-21 LAB — CBC WITH DIFFERENTIAL/PLATELET
Abs Immature Granulocytes: 0.07 10*3/uL (ref 0.00–0.07)
Basophils Absolute: 0 10*3/uL (ref 0.0–0.1)
Basophils Relative: 0 %
Eosinophils Absolute: 0.4 10*3/uL (ref 0.0–0.5)
Eosinophils Relative: 3 %
HCT: 49.3 % (ref 39.0–52.0)
Hemoglobin: 15.5 g/dL (ref 13.0–17.0)
Immature Granulocytes: 1 %
Lymphocytes Relative: 31 %
Lymphs Abs: 4.2 10*3/uL — ABNORMAL HIGH (ref 0.7–4.0)
MCH: 28.7 pg (ref 26.0–34.0)
MCHC: 31.4 g/dL (ref 30.0–36.0)
MCV: 91.1 fL (ref 80.0–100.0)
Monocytes Absolute: 1 10*3/uL (ref 0.1–1.0)
Monocytes Relative: 8 %
Neutro Abs: 8 10*3/uL — ABNORMAL HIGH (ref 1.7–7.7)
Neutrophils Relative %: 57 %
Platelets: 216 10*3/uL (ref 150–400)
RBC: 5.41 MIL/uL (ref 4.22–5.81)
RDW: 13 % (ref 11.5–15.5)
WBC: 13.8 10*3/uL — ABNORMAL HIGH (ref 4.0–10.5)
nRBC: 0 % (ref 0.0–0.2)

## 2019-01-21 LAB — RESPIRATORY PANEL BY RT PCR (FLU A&B, COVID)
Influenza A by PCR: NEGATIVE
Influenza B by PCR: NEGATIVE
SARS Coronavirus 2 by RT PCR: NEGATIVE

## 2019-01-21 LAB — BLOOD GAS, VENOUS
Acid-Base Excess: 3.4 mmol/L — ABNORMAL HIGH (ref 0.0–2.0)
Bicarbonate: 33.4 mmol/L — ABNORMAL HIGH (ref 20.0–28.0)
O2 Saturation: 49.4 %
Patient temperature: 37
pCO2, Ven: 78 mmHg (ref 44.0–60.0)
pH, Ven: 7.24 — ABNORMAL LOW (ref 7.250–7.430)
pO2, Ven: 32 mmHg (ref 32.0–45.0)

## 2019-01-21 LAB — BASIC METABOLIC PANEL
Anion gap: 13 (ref 5–15)
BUN: 20 mg/dL (ref 8–23)
CO2: 30 mmol/L (ref 22–32)
Calcium: 9.3 mg/dL (ref 8.9–10.3)
Chloride: 103 mmol/L (ref 98–111)
Creatinine, Ser: 1.45 mg/dL — ABNORMAL HIGH (ref 0.61–1.24)
GFR calc Af Amer: 57 mL/min — ABNORMAL LOW (ref >60)
GFR calc non Af Amer: 49 mL/min — ABNORMAL LOW (ref >60)
Glucose, Bld: 147 mg/dL — ABNORMAL HIGH (ref 70–99)
Potassium: 3.5 mmol/L (ref 3.5–5.1)
Sodium: 146 mmol/L — ABNORMAL HIGH (ref 135–145)

## 2019-01-21 LAB — TROPONIN I (HIGH SENSITIVITY)
Troponin I (High Sensitivity): 54 ng/L — ABNORMAL HIGH (ref <18)
Troponin I (High Sensitivity): 107 ng/L (ref <18)
Troponin I (High Sensitivity): 193 ng/L (ref <18)
Troponin I (High Sensitivity): 156 ng/L (ref <18)

## 2019-01-21 LAB — URINE DRUG SCREEN, QUALITATIVE (ARMC ONLY)
Amphetamines, Ur Screen: NOT DETECTED
Barbiturates, Ur Screen: NOT DETECTED
Benzodiazepine, Ur Scrn: NOT DETECTED
Cannabinoid 50 Ng, Ur ~~LOC~~: NOT DETECTED
Cocaine Metabolite,Ur ~~LOC~~: NOT DETECTED
MDMA (Ecstasy)Ur Screen: NOT DETECTED
Methadone Scn, Ur: NOT DETECTED
Opiate, Ur Screen: NOT DETECTED
Phencyclidine (PCP) Ur S: NOT DETECTED
Tricyclic, Ur Screen: NOT DETECTED

## 2019-01-21 LAB — BRAIN NATRIURETIC PEPTIDE: B Natriuretic Peptide: 633 pg/mL — ABNORMAL HIGH (ref 0.0–100.0)

## 2019-01-21 MED ORDER — SODIUM CHLORIDE 0.9% FLUSH: 3.0000 mL | INTRAVENOUS | Status: DC | PRN

## 2019-01-21 MED ORDER — CLOPIDOGREL BISULFATE 75 MG PO TABS
75.0000 mg | ORAL_TABLET | Freq: Every day | ORAL | Status: DC
  Administered 2019-01-22 – 2019-01-24 (×3): 75 mg via ORAL
  Filled 2019-01-21 (×4): qty 1

## 2019-01-21 MED ORDER — AMLODIPINE BESYLATE 5 MG PO TABS
5.0000 mg | ORAL_TABLET | Freq: Every day | ORAL | Status: DC
  Administered 2019-01-22 – 2019-01-24 (×3): 5 mg via ORAL
  Filled 2019-01-21 (×4): qty 1

## 2019-01-21 MED ORDER — IPRATROPIUM-ALBUTEROL 0.5-2.5 (3) MG/3ML IN SOLN
RESPIRATORY_TRACT | Status: AC
  Administered 2019-01-21: 3 mL via RESPIRATORY_TRACT
  Filled 2019-01-21: qty 3

## 2019-01-21 MED ORDER — THIAMINE HCL 100 MG PO TABS
100.0000 mg | ORAL_TABLET | Freq: Every day | ORAL | Status: DC
  Administered 2019-01-22 – 2019-01-24 (×3): 100 mg via ORAL
  Filled 2019-01-21 (×3): qty 1

## 2019-01-21 MED ORDER — NITROGLYCERIN 0.4 MG SL SUBL: 0.4000 mg | SUBLINGUAL_TABLET | SUBLINGUAL | Status: DC | PRN

## 2019-01-21 MED ORDER — IPRATROPIUM-ALBUTEROL 0.5-2.5 (3) MG/3ML IN SOLN
3.0000 mL | Freq: Once | RESPIRATORY_TRACT | Status: AC
  Administered 2019-01-21: 3 mL via RESPIRATORY_TRACT
  Filled 2019-01-21: qty 3

## 2019-01-21 MED ORDER — SODIUM CHLORIDE 0.9 % IV SOLN: 250.0000 mL | INTRAVENOUS | Status: DC | PRN

## 2019-01-21 MED ORDER — ASPIRIN EC 81 MG PO TBEC
81.0000 mg | DELAYED_RELEASE_TABLET | Freq: Every day | ORAL | Status: DC
  Administered 2019-01-22 – 2019-01-24 (×3): 81 mg via ORAL
  Filled 2019-01-21 (×4): qty 1

## 2019-01-21 MED ORDER — LORAZEPAM 1 MG PO TABS: 1.0000 mg | ORAL_TABLET | ORAL | Status: DC | PRN

## 2019-01-21 MED ORDER — MORPHINE SULFATE (PF) 2 MG/ML IV SOLN: 2.0000 mg | INTRAVENOUS | Status: DC | PRN

## 2019-01-21 MED ORDER — LORAZEPAM 2 MG/ML IJ SOLN: 1.0000 mg | INTRAMUSCULAR | Status: DC | PRN

## 2019-01-21 MED ORDER — ALBUTEROL SULFATE (2.5 MG/3ML) 0.083% IN NEBU: 2.5000 mg | INHALATION_SOLUTION | RESPIRATORY_TRACT | Status: DC | PRN

## 2019-01-21 MED ORDER — LORAZEPAM 2 MG/ML IJ SOLN
0.0000 mg | Freq: Two times a day (BID) | INTRAMUSCULAR | Status: DC
  Administered 2019-01-23: 4 mg via INTRAVENOUS
  Filled 2019-01-21: qty 2

## 2019-01-21 MED ORDER — FOLIC ACID 1 MG PO TABS
1.0000 mg | ORAL_TABLET | Freq: Every day | ORAL | Status: DC
  Administered 2019-01-22 – 2019-01-24 (×3): 1 mg via ORAL
  Filled 2019-01-21 (×3): qty 1

## 2019-01-21 MED ORDER — ATORVASTATIN CALCIUM 20 MG PO TABS
40.0000 mg | ORAL_TABLET | Freq: Every evening | ORAL | Status: DC
  Administered 2019-01-22 – 2019-01-23 (×2): 40 mg via ORAL
  Filled 2019-01-21 (×4): qty 2

## 2019-01-21 MED ORDER — SODIUM CHLORIDE FLUSH 0.9 % IV SOLN
INTRAVENOUS | Status: AC
  Administered 2019-01-21: 3 mL via INTRAVENOUS
  Filled 2019-01-21: qty 10

## 2019-01-21 MED ORDER — HYDRALAZINE HCL 25 MG PO TABS
25.0000 mg | ORAL_TABLET | Freq: Three times a day (TID) | ORAL | Status: DC | PRN
  Filled 2019-01-21: qty 1

## 2019-01-21 MED ORDER — FUROSEMIDE 10 MG/ML IJ SOLN
40.0000 mg | Freq: Once | INTRAMUSCULAR | Status: AC
  Administered 2019-01-21: 40 mg via INTRAVENOUS
  Filled 2019-01-21: qty 4

## 2019-01-21 MED ORDER — NITROGLYCERIN 2 % TD OINT
1.0000 [in_us] | TOPICAL_OINTMENT | Freq: Once | TRANSDERMAL | Status: AC
  Administered 2019-01-21: 1 [in_us] via TOPICAL
  Filled 2019-01-21: qty 1

## 2019-01-21 MED ORDER — ALBUTEROL SULFATE (2.5 MG/3ML) 0.083% IN NEBU: 2.5000 mg | INHALATION_SOLUTION | RESPIRATORY_TRACT | Status: DC

## 2019-01-21 MED ORDER — ADULT MULTIVITAMIN W/MINERALS CH
1.0000 | ORAL_TABLET | Freq: Every day | ORAL | Status: DC
  Administered 2019-01-22 – 2019-01-24 (×3): 1 via ORAL
  Filled 2019-01-21 (×3): qty 1

## 2019-01-21 MED ORDER — ENOXAPARIN SODIUM 40 MG/0.4ML ~~LOC~~ SOLN
40.0000 mg | SUBCUTANEOUS | Status: DC
  Administered 2019-01-21 – 2019-01-23 (×3): 40 mg via SUBCUTANEOUS
  Filled 2019-01-21 (×5): qty 0.4

## 2019-01-21 MED ORDER — ACETAMINOPHEN 325 MG PO TABS: 650.0000 mg | ORAL_TABLET | ORAL | Status: DC | PRN

## 2019-01-21 MED ORDER — BISOPROLOL FUMARATE 5 MG PO TABS
10.0000 mg | ORAL_TABLET | Freq: Every evening | ORAL | Status: DC
  Administered 2019-01-22 – 2019-01-23 (×2): 10 mg via ORAL
  Filled 2019-01-21 (×4): qty 2

## 2019-01-21 MED ORDER — SODIUM CHLORIDE 0.9% FLUSH
3.0000 mL | Freq: Two times a day (BID) | INTRAVENOUS | Status: DC
  Administered 2019-01-21 – 2019-01-24 (×5): 3 mL via INTRAVENOUS

## 2019-01-21 MED ORDER — DM-GUAIFENESIN ER 30-600 MG PO TB12
1.0000 | ORAL_TABLET | Freq: Two times a day (BID) | ORAL | Status: DC
  Administered 2019-01-21 – 2019-01-24 (×6): 1 via ORAL
  Filled 2019-01-21 (×9): qty 1

## 2019-01-21 MED ORDER — FUROSEMIDE 10 MG/ML IJ SOLN
40.0000 mg | Freq: Two times a day (BID) | INTRAMUSCULAR | Status: DC
  Administered 2019-01-22 (×2): 40 mg via INTRAVENOUS
  Filled 2019-01-21: qty 4

## 2019-01-21 MED ORDER — THIAMINE HCL 100 MG/ML IJ SOLN
100.0000 mg | Freq: Every day | INTRAMUSCULAR | Status: DC
  Filled 2019-01-21: qty 1

## 2019-01-21 MED ORDER — LORAZEPAM 2 MG/ML IJ SOLN: 0.0000 mg | Freq: Four times a day (QID) | INTRAMUSCULAR | Status: AC

## 2019-01-21 MED ORDER — ONDANSETRON HCL 4 MG/2ML IJ SOLN: 4.0000 mg | Freq: Four times a day (QID) | INTRAMUSCULAR | Status: DC | PRN

## 2019-01-21 MED ORDER — METHYLPREDNISOLONE SODIUM SUCC 125 MG IJ SOLR
125.0000 mg | Freq: Once | INTRAMUSCULAR | Status: AC
  Administered 2019-01-21: 125 mg via INTRAVENOUS
  Filled 2019-01-21: qty 2

## 2019-01-21 MED ORDER — ASPIRIN EC 81 MG PO TBEC: 81.0000 mg | DELAYED_RELEASE_TABLET | Freq: Every day | ORAL | Status: DC

## 2019-01-21 MED ORDER — IPRATROPIUM-ALBUTEROL 0.5-2.5 (3) MG/3ML IN SOLN
3.0000 mL | RESPIRATORY_TRACT | Status: DC
  Administered 2019-01-21 – 2019-01-23 (×9): 3 mL via RESPIRATORY_TRACT
  Filled 2019-01-21 (×7): qty 3

## 2019-01-21 NOTE — ED Notes (Signed)
X-ray at bedside

## 2019-01-21 NOTE — ED Provider Notes (Signed)
[facility] Emergency Department Provider Note       Time seen: ----------------------------------------- 1:56 PM on 01/21/2019 -----------------------------------------   I have reviewed the triage vital signs and the nursing notes.  HISTORY   Chief Complaint No chief complaint on file.   HPI [name] is a 69 y.o. male with a history of arthritis, hypertension, hyperlipidemia, peripheral vascular disease who presents to the ED for severe shortness of breath.  Patient arrives emergency traffic after having tried CPAP prehospital without any improvement.  He arrives on high flow nasal cannula oxygen.  Patient states he has had severe worsening shortness of breath over the last 2 to 3 days.  Past Medical History:  Diagnosis Date  . Arthritis   . Hyperlipidemia   . Hypertension   . Peripheral vascular disease [facility])     Patient Active Problem List   Diagnosis Date Noted  . History of cardiac cath 09/16/2018  . OSA (obstructive sleep apnea) 09/16/2018  . Angina pectoris ([place]) 08/04/2018  . Lymphedema 07/15/2018  . 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 Atherosclerosis of native artery of both lower extremities with intermittent claudication ([place]) 12/20/2017  . Hyperlipidemia 12/07/2017  . Hypertension 12/07/2017  . Atherosclerosis of native arteries of extremity with intermittent claudication ([place]) 12/07/2017    Past Surgical History:  Procedure Laterality Date  . ESOPHAGOGASTRODUODENOSCOPY    . HERNIA REPAIR    . LEFT HEART CATH AND CORONARY ANGIOGRAPHY Left 08/11/2018   Procedure: LEFT HEART CATH AND CORONARY ANGIOGRAPHY;  Surgeon: Corey Skains, MD;  Location: [place] CV LAB;  Service: Cardiovascular;  Laterality: Left;  . LOWER EXTREMITY ANGIOGRAPHY Right 01/24/2018   Procedure: LOWER EXTREMITY ANGIOGRAPHY;  Surgeon: Algernon Huxley, MD;  Location: [place] CV [facility];  Service: Cardiovascular;  Laterality: Right;     Allergies Patient has no known allergies.  Social History Social History   Tobacco Use  . Smoking status: Never Smoker  . Smokeless tobacco: Never Used  Substance Use Topics  . Alcohol use: Yes    Alcohol/week: 8.0 - 12.0 standard drinks    Types: 8 - 12 Shots of liquor per week  . Drug use: Never    Review of Systems Constitutional: Negative for fever. Cardiovascular: Negative for chest pain. Respiratory: Positive for shortness of breath Gastrointestinal: Negative for abdominal fullness Musculoskeletal: Negative for back pain. Skin: Negative for rash. Neurological: Negative for headaches  All systems negative/normal/unremarkable except as stated in the HPI  ____________________________________________   PHYSICAL EXAM:  VITAL SIGNS: ED Triage Vitals  Enc Vitals Group     BP      Pulse      Resp      Temp      Temp src      SpO2      Weight      Height      Head Circumference      Peak Flow      Pain Score      Pain Loc      Pain Edu?      Excl. in [place]?    Constitutional: Alert and oriented.  Moderate to severe distress Eyes: Conjunctivae are normal. Normal extraocular movements. ENT      Head: Normocephalic and atraumatic.      Nose: No congestion/rhinnorhea.      Mouth/Throat: Mucous membranes are moist.      Neck: No stridor. Cardiovascular: Normal rate, regular rhythm. No murmurs, rubs, or gallops. Respiratory: Tachypnea  with rales and rhonchi bilaterally Gastrointestinal: Soft, distended, nontender Musculoskeletal: Nontender with normal range of motion in extremities. Neurologic:  Normal speech and language. No gross focal neurologic deficits are appreciated.  Skin:  Skin is warm, dry and intact. No rash noted. Psychiatric: Anxious, normal behavior ____________________________________________  EKG: Interpreted by me.  Sinus tachycardia with rate of 114 bpm, PVCs, left atrial enlargement, nonspecific T wave  abnormalities  ____________________________________________  ED COURSE:  As part of my medical decision making, I reviewed the following data within the electronic MEDICAL RECORD NUMBER History obtained from family if available, nursing notes, old chart and ekg, as well as notes from prior ED visits. Patient presented for respiratory distress, we will assess with labs and imaging as indicated at this time.   Procedures  [name] was evaluated in Emergency Department on 01/21/2019 for the symptoms described in the history of present illness. He was evaluated in the context of the global COVID-19 pandemic, which necessitated consideration that the patient might be at risk for infection with the SARS-CoV-2 virus that causes COVID-19. Institutional protocols and algorithms that pertain to the evaluation of patients at risk for COVID-19 are in a state of rapid change based on information released by regulatory bodies including the CDC and federal and state organizations. These policies and algorithms were followed during the patient's care in the ED.  ____________________________________________   LABS (pertinent positives/negatives)  Labs Reviewed  CBC WITH DIFFERENTIAL/PLATELET - Abnormal; Notable for the following components:      Result Value   WBC 13.8 (*)    Neutro Abs 8.0 (*)    Lymphs Abs 4.2 (*)    All other components within normal limits  BASIC METABOLIC PANEL - Abnormal; Notable for the following components:   Sodium 146 (*)    Glucose, Bld 147 (*)    Creatinine, Ser 1.45 (*)    GFR calc non Af Amer 49 (*)    GFR calc Af Amer 57 (*)    All other components within normal limits  BRAIN NATRIURETIC PEPTIDE - Abnormal; Notable for the following components:   B Natriuretic Peptide 633.0 (*)    All other components within normal limits  BLOOD GAS, VENOUS - Abnormal; Notable for the following components:   pH, Ven 7.24 (*)    pCO2, Ven 78 (*)    Bicarbonate 33.4 (*)    Acid-Base  Excess 3.4 (*)    All other components within normal limits  TROPONIN I (HIGH SENSITIVITY) - Abnormal; Notable for the following components:   Troponin I (High Sensitivity) 54 (*)    All other components within normal limits  RESPIRATORY PANEL BY RT PCR (FLU A&B, COVID)   CRITICAL CARE Performed by: Ulice Dash   Total critical care time: 30 minutes  Critical care time was exclusive of separately billable procedures and treating other patients.  Critical care was necessary to treat or prevent imminent or life-threatening deterioration.  Critical care was time spent personally by me on the following activities: development of treatment plan with patient and/or surrogate as well as nursing, discussions with consultants, evaluation of patient's response to treatment, examination of patient, obtaining history from patient or surrogate, ordering and performing treatments and interventions, ordering and review of laboratory studies, ordering and review of radiographic studies, pulse oximetry and re-evaluation of patient's condition.  RADIOLOGY Images were viewed by me  Chest x-ray IMPRESSION:  Cardiac enlargement and pulmonary vascular congestion.  ____________________________________________   DIFFERENTIAL DIAGNOSIS   CHF,  COPD, pneumonia, COVID-19, hypoxia, PE, pneumothorax, MI  FINAL ASSESSMENT AND PLAN  Acute respiratory distress, acute decompensated CHF, hypercarbia   Plan: The patient had presented for acute respiratory distress. Patient's labs were likely indicative of a multifactorial respiratory distress with a combination of hypercarbic respiratory failure and congestive heart failure. Patient's imaging did reveal cardiac enlargement with pulmonary vascular congestion.  Patient was immediately placed on BiPAP upon arrival with dramatic improvement in his symptoms.  We applied nitroglycerin paste as well as ordered IV Lasix for him and gave him breathing treatments  with steroids.  Patient appeared close to needing to be intubated on arrival due to respiratory distress but has improved considerably.  I will discuss with the hospitalist for stepdown admission.   Ulice Dash, MD    Note: This note was generated in part or whole with voice recognition software. Voice recognition is usually quite accurate but there are transcription errors that can and very often do occur. I apologize for any typographical errors that were not detected and corrected.     Emily Filbert, MD 01/21/19 [phone]

## 2019-01-21 NOTE — ED Notes (Signed)
ED TO INPATIENT HANDOFF REPORT  ED Nurse Name and Phone #: Woody Seller Name/Age/Gender Lorrine Kin 69 y.o. male Room/Bed: ED06A/ED06A  Code Status   Code Status: Prior  Home/SNF/Other Home Patient oriented to: self, place, time and situation Is this baseline? Yes   Triage Complete: Triage complete  Chief Complaint Acute on chronic systolic (congestive) heart failure (HCC) [I50.23]  Triage Note Pt arrives [facility] emergency traffic from home for SOB. Extremely tachypnic, tachycardic, distended abd. Can't lie down. RT called upon arrival. EDP at bedside.     Allergies No Known Allergies  Level of Care/Admitting Diagnosis ED Disposition    ED Disposition Condition Comment   Admit  Hospital Area: [facility] [[id number]]  Level of Care: Stepdown [14]  Covid Evaluation: Confirmed COVID Negative  Diagnosis: Acute on chronic systolic (congestive) heart failure [facility]) [[id number]]  Admitting Physician: Lorretta Harp [[id number]]  Attending Physician: Lorretta Harp [phone]  Estimated length of stay: past midnight tomorrow  Certification:: I certify this patient will need inpatient services for at least 2 midnights       B Medical/Surgery History Past Medical History:  Diagnosis Date  . Arthritis   . Hyperlipidemia   . Hypertension   . Peripheral vascular disease [facility])    Past Surgical History:  Procedure Laterality Date  . ESOPHAGOGASTRODUODENOSCOPY    . HERNIA REPAIR    . LEFT HEART CATH AND CORONARY ANGIOGRAPHY Left 08/11/2018   Procedure: LEFT HEART CATH AND CORONARY ANGIOGRAPHY;  Surgeon: Lamar Blinks, MD;  Location: [facility] INVASIVE CV LAB;  Service: Cardiovascular;  Laterality: Left;  . LOWER EXTREMITY ANGIOGRAPHY Right 01/24/2018   Procedure: LOWER EXTREMITY ANGIOGRAPHY;  Surgeon: Annice Needy, MD;  Location: [facility] INVASIVE CV LAB;  Service: Cardiovascular;  Laterality: Right;     A IV Location/Drains/Wounds Patient Lines/Drains/Airways Status   Active  Line/Drains/Airways    Name:   Placement date:   Placement time:   Site:   Days:   Peripheral IV 01/21/19 Right Antecubital   01/21/19    1432    Antecubital   less than 1   Sheath 01/24/18   01/24/18    1026    --   362          Intake/Output Last 24 hours  Intake/Output Summary (Last 24 hours) at 01/21/2019 1947 Last data filed at 01/21/2019 1852 Gross per 24 hour  Intake --  Output 1350 ml  Net -1350 ml    Labs/Imaging Results for orders placed or performed during the hospital encounter of 01/21/19 (from the past 48 hour(s))  CBC with Differential     Status: Abnormal   Collection Time: 01/21/19  2:06 PM  Result Value Ref Range   WBC 13.8 (H) 4.0 - 10.5 K/uL   RBC 5.41 4.22 - 5.81 MIL/uL   Hemoglobin 15.5 13.0 - 17.0 g/dL   HCT 70.0 17.4 - 94.4 %   MCV 91.1 80.0 - 100.0 fL   MCH 28.7 26.0 - 34.0 pg   MCHC 31.4 30.0 - 36.0 g/dL   RDW 96.7 59.1 - 63.8 %   Platelets 216 150 - 400 K/uL   nRBC 0.0 0.0 - 0.2 %   Neutrophils Relative % 57 %   Neutro Abs 8.0 (H) 1.7 - 7.7 K/uL   Lymphocytes Relative 31 %   Lymphs Abs 4.2 (H) 0.7 - 4.0 K/uL   Monocytes Relative 8 %   Monocytes Absolute 1.0 0.1 - 1.0 K/uL   Eosinophils Relative 3 %  Eosinophils Absolute 0.4 0.0 - 0.5 K/uL   Basophils Relative 0 %   Basophils Absolute 0.0 0.0 - 0.1 K/uL   Immature Granulocytes 1 %   Abs Immature Granulocytes 0.07 0.00 - 0.07 K/uL    Comment: Performed at [facility], [address] [place], [place] [address]  Basic metabolic panel     Status: Abnormal   Collection Time: 01/21/19  2:06 PM  Result Value Ref Range   Sodium 146 (H) 135 - 145 mmol/L   Potassium 3.5 3.5 - 5.1 mmol/L   Chloride 103 98 - 111 mmol/L   CO2 30 22 - 32 mmol/L   Glucose, Bld 147 (H) 70 - 99 mg/dL   BUN 20 8 - 23 mg/dL   Creatinine, Ser 8.46 (H) 0.61 - 1.24 mg/dL   Calcium 9.3 8.9 - 96.2 mg/dL   GFR calc non Af Amer 49 (L) >60 mL/min   GFR calc Af Amer 57 (L) >60 mL/min   Anion gap 13 5 - 15     Comment: Performed at [facility], [address] [place], [place] [address]  Brain natriuretic peptide     Status: Abnormal   Collection Time: 01/21/19  2:06 PM  Result Value Ref Range   B Natriuretic Peptide 633.0 (H) 0.0 - 100.0 pg/mL    Comment: Performed at [facility], [address] [place], [place] [address]  Troponin I (High Sensitivity)     Status: Abnormal   Collection Time: 01/21/19  2:06 PM  Result Value Ref Range   Troponin I (High Sensitivity) 54 (H) <18 ng/L    Comment: (NOTE) Elevated high sensitivity troponin I (hsTnI) values and significant  changes across serial measurements may suggest ACS but many other  chronic and acute conditions are known to elevate hsTnI results.  Refer to the "Links" section for chest pain algorithms and additional  guidance. Performed at [facility], [address] [place], [place] [address]   Blood gas, venous     Status: Abnormal   Collection Time: 01/21/19  2:06 PM  Result Value Ref Range   pH, Ven 7.24 (L) 7.250 - 7.430   pCO2, Ven 78 (HH) 44.0 - 60.0 mmHg    Comment: CRITICAL RESULT CALLED TO, READ BACK BY AND VERIFIED WITH: CRITICAL VALUE 01/21/19,1440,DR. WILLIAMS/FD    pO2, Ven 32.0 32.0 - 45.0 mmHg   Bicarbonate 33.4 (H) 20.0 - 28.0 mmol/L   Acid-Base Excess 3.4 (H) 0.0 - 2.0 mmol/L   O2 Saturation 49.4 %   Patient temperature 37.0    Collection site VEIN    Sample type VEIN     Comment: Performed at [facility], [address], [place], [place] [address]  Respiratory Panel by RT PCR (Flu A&B, Covid) - Nasopharyngeal Swab     Status: None   Collection Time: 01/21/19  2:06 PM   Specimen: Nasopharyngeal Swab  Result Value Ref Range   SARS Coronavirus 2 by RT PCR NEGATIVE NEGATIVE    Comment: (NOTE) SARS-CoV-2 target nucleic acids are NOT DETECTED. The SARS-CoV-2 RNA is generally detectable in upper respiratoy specimens during the acute phase of infection. The  lowest concentration of SARS-CoV-2 viral copies this assay can detect is 131 copies/mL. A negative result does not preclude SARS-Cov-2 infection and should not be used as the sole basis for treatment or other patient management decisions. A negative result may occur with  improper specimen collection/handling, submission of specimen other than nasopharyngeal swab, presence of  viral mutation(s) within the areas targeted by this assay, and inadequate number of viral copies (<131 copies/mL). A negative result must be combined with clinical observations, patient history, and epidemiological information. The expected result is Negative. Fact Sheet for Patients:  PinkCheek.be Fact Sheet for Healthcare Providers:  GravelBags.it This test is not yet ap proved or cleared by the [place] FDA and  has been authorized for detection and/or diagnosis of SARS-CoV-2 by FDA under an Emergency Use Authorization (EUA). This EUA will remain  in effect (meaning this test can be used) for the duration of the COVID-19 declaration under Section 564(b)(1) of the Act, 21 U.S.C. section 360bbb-3(b)(1), unless the authorization is terminated or revoked sooner.    Influenza A by PCR NEGATIVE NEGATIVE   Influenza B by PCR NEGATIVE NEGATIVE    Comment: (NOTE) The Xpert Xpress SARS-CoV-2/FLU/RSV assay is intended as an aid in  the diagnosis of influenza from Nasopharyngeal swab specimens and  should not be used as a sole basis for treatment. Nasal washings and  aspirates are unacceptable for Xpert Xpress SARS-CoV-2/FLU/RSV  testing. Fact Sheet for Patients: PinkCheek.be Fact Sheet for Healthcare Providers: GravelBags.it This test is not yet approved or cleared by the [place] FDA and  has been authorized for detection and/or diagnosis of SARS-CoV-2 by  FDA under an Emergency Use  Authorization (EUA). This EUA will remain  in effect (meaning this test can be used) for the duration of the  Covid-19 declaration under Section 564(b)(1) of the Act, 21  U.S.C. section 360bbb-3(b)(1), unless the authorization is  terminated or revoked. Performed at [facility], [address] [place], [place] [address]   Troponin I (High Sensitivity)     Status: Abnormal   Collection Time: 01/21/19  3:42 PM  Result Value Ref Range   Troponin I (High Sensitivity) 107 (HH) <18 ng/L    Comment: CRITICAL RESULT CALLED TO, READ BACK BY AND VERIFIED WITH KATE BUMGARNER @1619  ON 01/21/2019 BY FMW (NOTE) Elevated high sensitivity troponin I (hsTnI) values and significant  changes across serial measurements may suggest ACS but many other  chronic and acute conditions are known to elevate hsTnI results.  Refer to the "Links" section for chest pain algorithms and additional  guidance. Performed at [facility], [address] [place], [place] [address]   Troponin I (High Sensitivity)     Status: Abnormal   Collection Time: 01/21/19  6:57 PM  Result Value Ref Range   Troponin I (High Sensitivity) 193 (HH) <18 ng/L    Comment: CRITICAL VALUE NOTED. VALUE IS CONSISTENT WITH PREVIOUSLY REPORTED/CALLED VALUE [place] (NOTE) Elevated high sensitivity troponin I (hsTnI) values and significant  changes across serial measurements may suggest ACS but many other  chronic and acute conditions are known to elevate hsTnI results.  Refer to the "Links" section for chest pain algorithms and additional  guidance. Performed at [facility], [address], [place], [place] [address]    DG Chest Port 1 View  Result Date: 01/21/2019 CLINICAL DATA:  Dyspnea. EXAM: PORTABLE CHEST 1 VIEW COMPARISON:  None FINDINGS: Decreased lung volumes. Cardiac enlargement and pulmonary vascular congestion. No airspace opacities. IMPRESSION: Cardiac enlargement and pulmonary vascular congestion.  Electronically Signed   By: Kerby Moors M.D.   On: 01/21/2019 14:46    Pending Labs [employer] (From admission, onward)    Start     Ordered   Signed and Held  AM - HgA1c  Tomorrow morning,   R  Signed and Held   Signed and Held  AM - FLP  Tomorrow morning,   R     Signed and Held   Signed and Held  Urine Drug Screen, Firefighter ([facility] only)  Once,   R     Signed and Held   Signed and Held  HIV Antibody (routine testing w rflx)  (HIV Antibody (Routine testing w reflex) panel)  Once,   R     Signed and Held   Signed and Held  Basic metabolic panel  Daily,   R     Signed and Held          Vitals/Pain Today's Vitals   01/21/19 1700 01/21/19 1730 01/21/19 1800 01/21/19 1830  BP: (!) 146/69 132/72 (!) 143/73 (!) 150/75  Pulse: 77 71 72 72  Resp: (!) 22 18 18 18   Temp:      TempSrc:      SpO2: 98% 97% 96% 100%  Weight:      Height:      PainSc:        Isolation Precautions No active isolations  Medications Medications  furosemide (LASIX) injection 40 mg (has no administration in time range)  ipratropium-albuterol (DUONEB) 0.5-2.5 (3) MG/3ML nebulizer solution 3 mL (3 mLs Nebulization Given 01/21/19 1653)  dextromethorphan-guaiFENesin (MUCINEX DM) 30-600 MG per 12 hr tablet 1 tablet (1 tablet Oral Not Given 01/21/19 1853)  albuterol (PROVENTIL) (2.5 MG/3ML) 0.083% nebulizer solution 2.5 mg (has no administration in time range)  hydrALAZINE (APRESOLINE) tablet 25 mg (has no administration in time range)  LORazepam (ATIVAN) tablet 1-4 mg (has no administration in time range)    Or  LORazepam (ATIVAN) injection 1-4 mg (has no administration in time range)  thiamine tablet 100 mg (has no administration in time range)    Or  thiamine (B-1) injection 100 mg (has no administration in time range)  folic acid (FOLVITE) tablet 1 mg (has no administration in time range)  multivitamin with minerals tablet 1 tablet (has no administration in time range)  LORazepam (ATIVAN)  injection 0-4 mg (has no administration in time range)    Followed by  LORazepam (ATIVAN) injection 0-4 mg (has no administration in time range)  methylPREDNISolone sodium succinate (SOLU-MEDROL) 125 mg/2 mL injection 125 mg (125 mg Intravenous Given 01/21/19 1432)  nitroGLYCERIN (NITROGLYN) 2 % ointment 1 inch (1 inch Topical Given 01/21/19 1533)  furosemide (LASIX) injection 40 mg (40 mg Intravenous Given 01/21/19 1533)  ipratropium-albuterol (DUONEB) 0.5-2.5 (3) MG/3ML nebulizer solution 3 mL (3 mLs Nebulization Given 01/21/19 1533)    Mobility walks Low fall risk   Focused Assessments Respiratory   R Recommendations: See Admitting Provider Note  Report given to:   Additional Notes: bipap; troponin trending up

## 2019-01-21 NOTE — ED Triage Notes (Signed)
Pt arrives [facility] emergency traffic from home for SOB. Extremely tachypnic, tachycardic, distended abd. Can't lie down. RT called upon arrival. EDP at bedside.

## 2019-01-21 NOTE — ED Notes (Signed)
Called RT to set up breathing treatment.

## 2019-01-21 NOTE — H&P (Signed)
History and Physical    [name] NTI:[id number] DOB: [birth year] DOA: 01/21/2019  Referring MD/NP/PA:   PCP: Patrice Paradise, MD   Patient coming from:  The patient is coming from home.  At baseline, pt is independent for most of ADL.        Chief Complaint: SOB  HPI: [name] is a 69 y.o. male with medical history significant of hypertension, hyperlipidemia, sCHF with EF of 35-40% (by Echo stress test on 07/29/2018 in [facility]), PVD, OSA, alcohol abuse, who presents with shortness of breath.  Patient states that he has been having shortness of breath in the past several days, which has been progressively worsening. BiPAP is started in ED. He has mild cough, no fever or chills.  Denies chest pain.  No nausea, vomiting, diarrhea, abdominal pain, symptoms of UTI or unilateral weakness.  He has bilateral leg edema and abdominal swelling.  ED Course: pt was found to have BNP 633, negative RVP for COVID-19, troponin 54, WBC 13.8, renal function close to baseline, temperature normal, tachycardia, blood pressure 149/67, tachypnea, chest x-ray showed vascular congestion and cardiomegaly.  Patient is admitted to stepdown as inpatient.  Review of Systems:   General: no fevers, chills, no body weight gain, has poor appetite, has fatigue HEENT: no blurry vision, hearing changes or sore throat Respiratory: has dyspnea, coughing, no wheezing CV: no chest pain, no palpitations GI: no nausea, vomiting, abdominal pain, diarrhea, constipation GU: no dysuria, burning on urination, increased urinary frequency, hematuria  Ext: has leg edema Neuro: no unilateral weakness, numbness, or tingling, no vision change or hearing loss Skin: no rash, no skin tear. MSK: No muscle spasm, no deformity, no limitation of range of movement in spin Heme: No easy bruising.  Travel history: No recent long distant travel.  Allergy: No Known Allergies  Past Medical History:  Diagnosis Date  . Arthritis   .  Hyperlipidemia   . Hypertension   . Peripheral vascular disease [facility])     Past Surgical History:  Procedure Laterality Date  . ESOPHAGOGASTRODUODENOSCOPY    . HERNIA REPAIR    . LEFT HEART CATH AND CORONARY ANGIOGRAPHY Left 08/11/2018   Procedure: LEFT HEART CATH AND CORONARY ANGIOGRAPHY;  Surgeon: Lamar Blinks, MD;  Location: [facility] INVASIVE CV LAB;  Service: Cardiovascular;  Laterality: Left;  . LOWER EXTREMITY ANGIOGRAPHY Right 01/24/2018   Procedure: LOWER EXTREMITY ANGIOGRAPHY;  Surgeon: Annice Needy, MD;  Location: [facility] INVASIVE CV LAB;  Service: Cardiovascular;  Laterality: Right;    Social History:  reports that he has never smoked. He has never used smokeless tobacco. He reports current alcohol use of about 8.0 - 12.0 standard drinks of alcohol per week. He reports that he does not use drugs.  Family History:  Family History  Problem Relation Age of Onset  . Uterine cancer Mother      Prior to Admission medications   Medication Sig Start Date End Date Taking? Authorizing Provider  amLODipine (NORVASC) 10 MG tablet Take 5 mg by mouth daily.  10/08/17  Yes [provider]  aspirin EC 81 MG tablet Take 1 tablet (81 mg total) by mouth daily. 01/24/18  Yes Dew, Marlow Baars, MD  atorvastatin (LIPITOR) 40 MG tablet Take 40 mg by mouth every evening.  10/23/13  Yes [provider]  bisoprolol (ZEBETA) 10 MG tablet Take 10 mg by mouth every evening. 01/26/18 01/26/19 Yes [provider]  clopidogrel (PLAVIX) 75 MG tablet Take 1 tablet (75 mg  total) by mouth daily. 01/24/18  Yes Annice Needy, MD  Homeopathic Products [facility] RELIEF EX) Apply 1 application topically daily as needed (pain relief).   Yes [provider]  potassium chloride SA (K-DUR,KLOR-CON) 20 MEQ tablet Take 40 mEq by mouth daily.  10/08/17  Yes [provider]  sildenafil (VIAGRA) 25 MG tablet Take 25 mg by mouth as needed for erectile dysfunction.    Yes [provider]     Physical Exam: Vitals:   01/21/19 1630 01/21/19 1700 01/21/19 1730 01/21/19 1800  BP: 133/72 (!) 146/69 132/72 (!) 143/73  Pulse: 75 77 71 72  Resp: 20 (!) 22 18 18   Temp:      TempSrc:      SpO2: 95% 98% 97% 96%  Weight:      Height:       General: Not in acute distress HEENT:       Eyes: PERRL, EOMI, no scleral icterus.       ENT: No discharge from the ears and nose, no pharynx injection, no tonsillar enlargement.        Neck: No JVD, no bruit, no mass felt. Heme: No neck lymph node enlargement. Cardiac: S1/S2, RRR, No murmurs, No gallops or rubs. Respiratory: Has fine crackles bilaterally GI: Soft, distended, nontender, no rebound pain, no organomegaly, BS present. GU: No hematuria Ext: 2+ pitting leg edema bilaterally. 2+DP/PT pulse bilaterally. Musculoskeletal: No joint deformities, No joint redness or warmth, no limitation of ROM in spin. Skin: No rashes.  Neuro: Alert, oriented X3, cranial nerves II-XII grossly intact, moves all extremities normally.  Psych: Patient is not psychotic, no suicidal or hemocidal ideation.  Labs on Admission: I have personally reviewed following labs and imaging studies  CBC: Recent Labs  Lab 01/21/19 1406  WBC 13.8*  NEUTROABS 8.0*  HGB 15.5  HCT 49.3  MCV 91.1  PLT 216   Basic Metabolic Panel: Recent Labs  Lab 01/21/19 1406  NA 146*  K 3.5  CL 103  CO2 30  GLUCOSE 147*  BUN 20  CREATININE 1.45*  CALCIUM 9.3   GFR: Estimated Creatinine Clearance: 58.2 mL/min (A) (by C-G formula based on SCr of 1.45 mg/dL (H)). Liver Function Tests: No results for input(s): AST, ALT, ALKPHOS, BILITOT, PROT, ALBUMIN in the last 168 hours. No results for input(s): LIPASE, AMYLASE in the last 168 hours. No results for input(s): AMMONIA in the last 168 hours. Coagulation Profile: No results for input(s): INR, PROTIME in the last 168 hours. Cardiac Enzymes: No results for input(s): CKTOTAL, CKMB, CKMBINDEX, TROPONINI in the last 168  hours. BNP (last 3 results) No results for input(s): PROBNP in the last 8760 hours. HbA1C: No results for input(s): HGBA1C in the last 72 hours. CBG: No results for input(s): GLUCAP in the last 168 hours. Lipid Profile: No results for input(s): CHOL, HDL, LDLCALC, TRIG, CHOLHDL, LDLDIRECT in the last 72 hours. Thyroid Function Tests: No results for input(s): TSH, T4TOTAL, FREET4, T3FREE, THYROIDAB in the last 72 hours. Anemia Panel: No results for input(s): VITAMINB12, FOLATE, FERRITIN, TIBC, IRON, RETICCTPCT in the last 72 hours. Urine analysis:    Component Value Date/Time   COLORURINE Amber 03/27/2012 2127   APPEARANCEUR Hazy 03/27/2012 2127   LABSPEC 1.027 03/27/2012 2127   PHURINE 5.0 03/27/2012 2127   GLUCOSEU Negative 03/27/2012 2127   HGBUR Negative 03/27/2012 2127   BILIRUBINUR Negative 03/27/2012 2127   KETONESUR Negative 03/27/2012 2127   PROTEINUR 30 mg/dL 2128 31/49/7026   NITRITE Negative  03/27/2012 2127   LEUKOCYTESUR Negative 03/27/2012 2127   Sepsis Labs: @LABRCNTIP (procalcitonin:4,lacticidven:4) ) Recent Results (from the past 240 hour(s))  Respiratory Panel by RT PCR (Flu A&B, Covid) - Nasopharyngeal Swab     Status: None   Collection Time: 01/21/19  2:06 PM   Specimen: Nasopharyngeal Swab  Result Value Ref Range Status   SARS Coronavirus 2 by RT PCR NEGATIVE NEGATIVE Final    Comment: (NOTE) SARS-CoV-2 target nucleic acids are NOT DETECTED. The SARS-CoV-2 RNA is generally detectable in upper respiratoy specimens during the acute phase of infection. The lowest concentration of SARS-CoV-2 viral copies this assay can detect is 131 copies/mL. A negative result does not preclude SARS-Cov-2 infection and should not be used as the sole basis for treatment or other patient management decisions. A negative result may occur with  improper specimen collection/handling, submission of specimen other than nasopharyngeal swab, presence of viral mutation(s) within  the areas targeted by this assay, and inadequate number of viral copies (<131 copies/mL). A negative result must be combined with clinical observations, patient history, and epidemiological information. The expected result is Negative. Fact Sheet for Patients:  03/21/19 Fact Sheet for Healthcare Providers:  https://www.moore.com/ This test is not yet ap proved or cleared by the https://www.young.biz/ FDA and  has been authorized for detection and/or diagnosis of SARS-CoV-2 by FDA under an Emergency Use Authorization (EUA). This EUA will remain  in effect (meaning this test can be used) for the duration of the COVID-19 declaration under Section 564(b)(1) of the Act, 21 U.S.C. section 360bbb-3(b)(1), unless the authorization is terminated or revoked sooner.    Influenza A by PCR NEGATIVE NEGATIVE Final   Influenza B by PCR NEGATIVE NEGATIVE Final    Comment: (NOTE) The Xpert Xpress SARS-CoV-2/FLU/RSV assay is intended as an aid in  the diagnosis of influenza from Nasopharyngeal swab specimens and  should not be used as a sole basis for treatment. Nasal washings and  aspirates are unacceptable for Xpert Xpress SARS-CoV-2/FLU/RSV  testing. Fact Sheet for Patients: [place] Fact Sheet for Healthcare Providers: https://www.moore.com/ This test is not yet approved or cleared by the https://www.young.biz/ FDA and  has been authorized for detection and/or diagnosis of SARS-CoV-2 by  FDA under an Emergency Use Authorization (EUA). This EUA will remain  in effect (meaning this test can be used) for the duration of the  Covid-19 declaration under Section 564(b)(1) of the Act, 21  U.S.C. section 360bbb-3(b)(1), unless the authorization is  terminated or revoked. Performed at [facility], [address], [place], [place]      Radiological Exams on Admission: DG Chest [facility] 1  View  Result Date: 01/21/2019 CLINICAL DATA:  Dyspnea. EXAM: PORTABLE CHEST 1 VIEW COMPARISON:  None FINDINGS: Decreased lung volumes. Cardiac enlargement and pulmonary vascular congestion. No airspace opacities. IMPRESSION: Cardiac enlargement and pulmonary vascular congestion. Electronically Signed   By: 03/21/2019 M.D.   On: 01/21/2019 14:46     EKG: Independently reviewed.  Sinus rhythm, QTC 473, LAD, PVC, anteroseptal infarction pattern   Assessment/Plan Principal Problem:   Acute on chronic systolic CHF (congestive heart failure) (HCC) Active Problems:   Hyperlipidemia   Hypertension   OSA (obstructive sleep apnea)   PVD (peripheral vascular disease) (HCC)   Acute respiratory failure with hypoxia (HCC)   Elevated troponin   Acute on chronic systolic (congestive) heart failure (HCC)   Alcohol abuse   CKD (chronic kidney disease), stage IIIa  Acute respiratory failure with hypoxia due  to acute on chronic systolic CHF and elevated trop: Patient does not carry history of CHF, but chart review showed that the patient had echo stress test on 07/29/2018 in [place], which showed EF of 35-40%.  Patient has elevated BNP 633, vascular congestion and cardiomegaly on chest x-ray, 2+ leg edema, clinically consistent with CHF exacerbation.  Patient has a mildly elevated troponin 54, but no chest pain.  Most likely due to demand ischemia secondary to CHF exacerbation.  -will admit to SDU as inpt. - on BiPAP -Lasix 40 mg bid by IV -trend trop - check A1c, FLP and UDS -2d echo -Daily weights -strict I/O's -Low salt diet -Fluid restriction  Hyperlipidemia: -lipitor  HTN:  -Continue home medications: Amlodipine, Zebeta -hydralazine prn  OSA (obstructive sleep apnea): -on BiPAP  now  PVD (peripheral vascular disease) ([place]): -on plavix  Alcohol abuse: -CiWA protocol  CKD (chronic kidney disease), stage IIIa: Close to baseline.  Baseline creatinine 1.2-1.4.  His creatinine is 1.45,  BUN 20. -Follow-up of BMP   Inpatient status:  # Patient requires inpatient status due to high intensity of service, high risk for further deterioration and high frequency of surveillance required.  I certify that at the point of admission it is my clinical judgment that the patient will require inpatient hospital care spanning beyond 2 midnights from the point of admission.  . This patient has multiple chronic comorbidities including hypertension, hyperlipidemia, sCHF with EF of 35-40% (by Echo stress test on 07/29/2018 in [facility]), PVD, OSA, alcohol abuse . Now patient has presenting with acute respiratory failure with hypoxia due to acute on chronic systolic CHF and elevated trop . The worrisome physical exam findings include 2+ leg edema . The initial radiographic and laboratory data are worrisome because of elevated BNP, elevated troponin, vascular congestion and cardiomegaly on chest x-ray. . Current medical needs: please see my assessment and plan . Predictability of an adverse outcome (risk): Patient has multiple comorbidities as listed above. Now presents with acute respiratory failure with hypoxia due to acute on chronic systolic CHF and elevated trop. Pt needs BiPAP. His presentation is highly complicated.  Patient is at high risk of deteriorating.  Will need to be treated in hospital for at least 2 days.      DVT ppx: SQ Lovenox Code Status: Full code Family Communication: None at bed side.   Disposition Plan:  Anticipate discharge back to previous home environment Consults called:  none Admission status:   SDU/inpation       Date of Service 01/21/2019    [facility]   If 7PM-7AM, please contact night-coverage [email or web address] Password [facility] 01/21/2019, 6:26 PM

## 2019-01-21 NOTE — Progress Notes (Signed)
Transferred patient to same day surgery room for stepdown admission while on bipap. Patient tolerated transition well.  Placed patient on 3 liter nasal cannula as he states his breathing is much better than it was.  Bipap on standby in the event needs to be restarted.

## 2019-01-21 NOTE — ED Notes (Signed)
Date and time results received: 01/21/19 1619   Test: troponin Critical Value: 107  Name of Provider Notified: Dr. Clyde Lundborg

## 2019-01-22 ENCOUNTER — Inpatient Hospital Stay
Admit: 2019-01-22 | Discharge: 2019-01-22 | Disposition: A | Discharge status: Home / Self Care | Payer: Medicare Other | Attending: Internal Medicine | Admitting: Internal Medicine

## 2019-01-22 DIAGNOSIS — I5023 Acute on chronic systolic (congestive) heart failure: Secondary | ICD-10-CM

## 2019-01-22 LAB — BASIC METABOLIC PANEL
Anion gap: 12 (ref 5–15)
BUN: 24 mg/dL — ABNORMAL HIGH (ref 8–23)
CO2: 29 mmol/L (ref 22–32)
Calcium: 8.4 mg/dL — ABNORMAL LOW (ref 8.9–10.3)
Chloride: 103 mmol/L (ref 98–111)
Creatinine, Ser: 1.23 mg/dL (ref 0.61–1.24)
GFR calc Af Amer: >60 mL/min (ref >60)
GFR calc non Af Amer: >60 mL/min (ref >60)
Glucose, Bld: 196 mg/dL — ABNORMAL HIGH (ref 70–99)
Potassium: 4.2 mmol/L (ref 3.5–5.1)
Sodium: 144 mmol/L (ref 135–145)

## 2019-01-22 LAB — HIV ANTIBODY (ROUTINE TESTING W REFLEX): HIV Screen 4th Generation wRfx: NONREACTIVE

## 2019-01-22 LAB — LIPID PANEL
Cholesterol: 215 mg/dL — ABNORMAL HIGH (ref 0–200)
HDL: 50 mg/dL (ref >40)
LDL Cholesterol: 153 mg/dL — ABNORMAL HIGH (ref 0–99)
Total CHOL/HDL Ratio: 4.3 RATIO
Triglycerides: 61 mg/dL (ref <150)
VLDL: 12 mg/dL (ref 0–40)

## 2019-01-22 LAB — ECHOCARDIOGRAM COMPLETE
Height: 69 in
Weight: 4128 oz

## 2019-01-22 LAB — HEMOGLOBIN A1C
Hgb A1c MFr Bld: 5.3 % (ref 4.8–5.6)
Mean Plasma Glucose: 105.41 mg/dL

## 2019-01-22 MED ORDER — IPRATROPIUM-ALBUTEROL 0.5-2.5 (3) MG/3ML IN SOLN
RESPIRATORY_TRACT | Status: AC
  Administered 2019-01-22: 3 mL
  Filled 2019-01-22: qty 3

## 2019-01-22 MED ORDER — IPRATROPIUM-ALBUTEROL 0.5-2.5 (3) MG/3ML IN SOLN
RESPIRATORY_TRACT | Status: AC
  Administered 2019-01-22: 3 mL via RESPIRATORY_TRACT
  Filled 2019-01-22: qty 3

## 2019-01-22 MED ORDER — IPRATROPIUM-ALBUTEROL 0.5-2.5 (3) MG/3ML IN SOLN
RESPIRATORY_TRACT | Status: AC
  Filled 2019-01-22: qty 6

## 2019-01-22 MED ORDER — SODIUM CHLORIDE FLUSH 0.9 % IV SOLN
INTRAVENOUS | Status: AC
  Administered 2019-01-22: 3 mL via INTRAVENOUS
  Filled 2019-01-22: qty 10

## 2019-01-22 MED ORDER — FUROSEMIDE 10 MG/ML IJ SOLN
INTRAMUSCULAR | Status: AC
  Filled 2019-01-22: qty 4

## 2019-01-22 MED ORDER — INFLUENZA VAC A&B SA ADJ QUAD 0.5 ML IM PRSY
0.5000 mL | PREFILLED_SYRINGE | INTRAMUSCULAR | Status: AC
  Administered 2019-01-24: 0.5 mL via INTRAMUSCULAR
  Filled 2019-01-22: qty 0.5

## 2019-01-22 MED ORDER — SODIUM CHLORIDE FLUSH 0.9 % IV SOLN
INTRAVENOUS | Status: AC
  Filled 2019-01-22: qty 10

## 2019-01-22 MED ORDER — FUROSEMIDE 10 MG/ML IJ SOLN
40.0000 mg | Freq: Two times a day (BID) | INTRAMUSCULAR | Status: DC
  Administered 2019-01-23 – 2019-01-24 (×2): 40 mg via INTRAVENOUS
  Filled 2019-01-22 (×3): qty 4

## 2019-01-22 MED ORDER — PNEUMOCOCCAL VAC POLYVALENT 25 MCG/0.5ML IJ INJ
0.5000 mL | INJECTION | INTRAMUSCULAR | Status: AC
  Administered 2019-01-24: 0.5 mL via INTRAMUSCULAR
  Filled 2019-01-22: qty 0.5

## 2019-01-22 NOTE — Progress Notes (Signed)
69 y.o. male with medical history significant of hypertension, hyperlipidemia, sCHF with EF of 35-40% (by Echo stress test on 07/29/2018 in [facility]), PVD, OSA, alcohol abuse, who presents with shortness of breath. In the ED pt was found to have BNP 633, negative RVP for COVID-19, troponin 54, WBC 13.8, renal function close to baseline, temperature normal, tachycardia, blood pressure 149/67, tachypnea, chest x-ray showed vascular congestion and cardiomegaly.     Subjective: States feeling much better this morning.  Currently on 2 L supplemental oxygen via nasal cannula.  Objective: Vital signs in last 24 hours: Temp:  [97 F (36.1 C)-98.5 F (36.9 C)] 98.5 F (36.9 C) (01/03 1519) Pulse Rate:  [64-84] 82 (01/03 1519) Resp:  [15-30] 19 (01/03 1519) BP: (122-163)/(54-85) 122/54 (01/03 1519) SpO2:  [95 %-100 %] 98 % (01/03 1519) Weight:  [117 kg-129.9 kg] 129.9 kg (01/03 1528)  Intake/Output from previous day: 01/02 0701 - 01/03 0700 In: 960 [P.O.:960] Out: 2400 [Urine:2400] Intake/Output this shift: Total I/O In: 540 [P.O.:540] Out: 700 [Urine:700]  General: Not in acute distress HEENT:       Eyes: PERRL, EOMI, no scleral icterus.       ENT: No discharge from the ears and nose, no pharynx injection, no tonsillar enlargement.        Neck: No JVD, no bruit, no mass felt. Heme: No neck lymph node enlargement. Cardiac: S1/S2, RRR, No murmurs, No gallops or rubs. Respiratory:  Coarse breathing; has fine crackles bilaterally GI: Soft, distended, nontender, no rebound pain, no organomegaly, BS present. GU: No hematuria Ext: 2+ pitting leg edema bilaterally. 2+DP/PT pulse bilaterally. Musculoskeletal: No joint deformities, No joint redness or warmth, no limitation of ROM in spin. Skin: No rashes.  Neuro: Alert, oriented X3, cranial nerves II-XII grossly intact, moves all extremities normally.  Psych: Patient is not psychotic, no suicidal or hemocidal ideation.  Results for orders placed or  performed during the hospital encounter of 01/21/19 (from the past 24 hour(s))  Troponin I (High Sensitivity)     Status: Abnormal   Collection Time: 01/21/19  6:57 PM  Result Value Ref Range   Troponin I (High Sensitivity) 193 (HH) <18 ng/L  Urine Drug Screen, Qualitative ([facility] only)     Status: None   Collection Time: 01/21/19  9:00 PM  Result Value Ref Range   Tricyclic, Ur Screen NONE DETECTED NONE DETECTED   Amphetamines, Ur Screen NONE DETECTED NONE DETECTED   MDMA (Ecstasy)Ur Screen NONE DETECTED NONE DETECTED   Cocaine Metabolite,Ur [place] NONE DETECTED NONE DETECTED   Opiate, Ur Screen NONE DETECTED NONE DETECTED   Phencyclidine (PCP) Ur S NONE DETECTED NONE DETECTED   Cannabinoid 50 Ng, Ur [place] NONE DETECTED NONE DETECTED   Barbiturates, Ur Screen NONE DETECTED NONE DETECTED   Benzodiazepine, Ur Scrn NONE DETECTED NONE DETECTED   Methadone Scn, Ur NONE DETECTED NONE DETECTED  HIV Antibody (routine testing w rflx)     Status: None   Collection Time: 01/21/19 10:20 PM  Result Value Ref Range   HIV Screen 4th Generation wRfx NON REACTIVE NON REACTIVE  Troponin I (High Sensitivity)     Status: Abnormal   Collection Time: 01/21/19 10:20 PM  Result Value Ref Range   Troponin I (High Sensitivity) 156 (HH) <18 ng/L  AM - HgA1c     Status: None   Collection Time: 01/22/19  4:53 AM  Result Value Ref Range   Hgb A1c MFr Bld 5.3 4.8 - 5.6 %   Mean Plasma Glucose  105.41 mg/dL  AM - FLP     Status: Abnormal   Collection Time: 01/22/19  4:53 AM  Result Value Ref Range   Cholesterol 215 (H) 0 - 200 mg/dL   Triglycerides 61 <150 mg/dL   HDL 50 >40 mg/dL   Total CHOL/HDL Ratio 4.3 RATIO   VLDL 12 0 - 40 mg/dL   LDL Cholesterol 153 (H) 0 - 99 mg/dL  Basic metabolic panel     Status: Abnormal   Collection Time: 01/22/19  4:53 AM  Result Value Ref Range   Sodium 144 135 - 145 mmol/L   Potassium 4.2 3.5 - 5.1 mmol/L   Chloride 103 98 - 111 mmol/L   CO2 29 22 - 32 mmol/L   Glucose, Bld  196 (H) 70 - 99 mg/dL   BUN 24 (H) 8 - 23 mg/dL   Creatinine, Ser 1.23 0.61 - 1.24 mg/dL   Calcium 8.4 (L) 8.9 - 10.3 mg/dL   GFR calc non Af Amer >60 >60 mL/min   GFR calc Af Amer >60 >60 mL/min   Anion gap 12 5 - 15    Studies/Results: DG Chest Port 1 View  Result Date: 01/21/2019 CLINICAL DATA:  Dyspnea. EXAM: PORTABLE CHEST 1 VIEW COMPARISON:  None FINDINGS: Decreased lung volumes. Cardiac enlargement and pulmonary vascular congestion. No airspace opacities. IMPRESSION: Cardiac enlargement and pulmonary vascular congestion. Electronically Signed   By: Kerby Moors M.D.   On: 01/21/2019 14:46   ECHOCARDIOGRAM COMPLETE  Result Date: 01/22/2019   ECHOCARDIOGRAM REPORT   Patient Name:   [name] Date of Exam: 01/22/2019 Medical Rec #:  [id number]       Height:       69.0 in Accession #:    [id number]      Weight:       283.0 lb Date of Birth:  July 19, 1950       BSA:          2.39 m Patient Age:    66 years        BP:           154/76 mmHg Patient Gender: M               HR:           68 bpm. Exam Location:  [facility] Procedure: 2D Echo Indications:     CHF 428.31/ I50.31  History:         Patient has no prior history of Echocardiogram examinations.  Sonographer:     Arville Go RDCS Referring Phys:  Unknown Foley NIU Diagnosing Phys: Isaias Cowman MD  Sonographer Comments: Technically difficult study due to poor echo windows. IMPRESSIONS  1. Left ventricular ejection fraction, by visual estimation, is 55 to 60%. The left ventricle has normal function. There is mildly increased left ventricular hypertrophy.  2. Global right ventricle has normal systolic function.The right ventricular size is normal. No increase in right ventricular wall thickness.  3. Left atrial size was normal.  4. Right atrial size was normal.  5. The mitral valve is normal in structure. Mild mitral valve regurgitation. No evidence of mitral stenosis.  6. The tricuspid valve is normal in structure.  7. The aortic valve is  normal in structure. Aortic valve regurgitation is not visualized. No evidence of aortic valve sclerosis or stenosis.  8. The pulmonic valve was normal in structure. Pulmonic valve regurgitation is not visualized.  9. The inferior vena cava is normal in size with greater than  50% respiratory variability, suggesting right atrial pressure of 3 mmHg. FINDINGS  Left Ventricle: Left ventricular ejection fraction, by visual estimation, is 55 to 60%. The left ventricle has normal function. The left ventricle is not well visualized. There is mildly increased left ventricular hypertrophy. Normal left atrial pressure. Right Ventricle: The right ventricular size is normal. No increase in right ventricular wall thickness. Global RV systolic function is has normal systolic function. Left Atrium: Left atrial size was normal in size. Right Atrium: Right atrial size was normal in size Pericardium: There is no evidence of pericardial effusion. Mitral Valve: The mitral valve is normal in structure. Mild mitral valve regurgitation. No evidence of mitral valve stenosis by observation. Tricuspid Valve: The tricuspid valve is normal in structure. Tricuspid valve regurgitation is mild. Aortic Valve: The aortic valve is normal in structure. Aortic valve regurgitation is not visualized. The aortic valve is structurally normal, with no evidence of sclerosis or stenosis. Aortic valve mean gradient measures 4.0 mmHg. Aortic valve peak gradient measures 8.1 mmHg. Aortic valve area, by VTI measures 2.21 cm. Pulmonic Valve: The pulmonic valve was normal in structure. Pulmonic valve regurgitation is not visualized. Pulmonic regurgitation is not visualized. Aorta: The aortic root, ascending aorta and aortic arch are all structurally normal, with no evidence of dilitation or obstruction. Venous: The inferior vena cava is normal in size with greater than 50% respiratory variability, suggesting right atrial pressure of 3 mmHg. IAS/Shunts: No atrial  level shunt detected by color flow Doppler. There is no evidence of a patent foramen ovale. No ventricular septal defect is seen or detected. There is no evidence of an atrial septal defect.  LEFT VENTRICLE PLAX 2D LVIDd:         5.83 cm  Diastology LVIDs:         4.05 cm  LV e' lateral:   8.59 cm/s LV PW:         1.56 cm  LV E/e' lateral: 12.2 LV IVS:        1.90 cm  LV e' medial:    6.96 cm/s LVOT diam:     2.10 cm  LV E/e' medial:  15.1 LV SV:         96 ml LV SV Index:   37.67 LVOT Area:     3.46 cm  RIGHT VENTRICLE RV Basal diam:  3.02 cm RV S prime:     16.50 cm/s TAPSE (M-mode): 2.6 cm LEFT ATRIUM             Index       RIGHT ATRIUM           Index LA diam:        4.90 cm 2.05 cm/m  RA Area:     22.90 cm LA Vol (A2C):   53.2 ml 22.22 ml/m RA Volume:   69.80 ml  29.15 ml/m LA Vol (A4C):   82.9 ml 34.62 ml/m LA Biplane Vol: 69.2 ml 28.90 ml/m  AORTIC VALVE                   PULMONIC VALVE AV Area (Vmax):    2.25 cm    PV Vmax:       1.10 m/s AV Area (Vmean):   2.20 cm    PV Peak grad:  4.8 mmHg AV Area (VTI):     2.21 cm AV Vmax:           142.00 cm/s AV Vmean:  95.200 cm/s AV VTI:            0.267 m AV Peak Grad:      8.1 mmHg AV Mean Grad:      4.0 mmHg LVOT Vmax:         92.40 cm/s LVOT Vmean:        60.600 cm/s LVOT VTI:          0.170 m LVOT/AV VTI ratio: 0.64  AORTA Ao Root diam: 3.40 cm Ao Asc diam:  3.60 cm MITRAL VALVE MV Area (PHT): 4.15 cm              SHUNTS MV PHT:        53.07 msec            Systemic VTI:  0.17 m MV Decel Time: 183 msec              Systemic Diam: 2.10 cm MV E velocity: 105.00 cm/s 103 cm/s MV A velocity: 55.30 cm/s  70.3 cm/s MV E/A ratio:  1.90        1.5  Marcina Millard MD Electronically signed by Marcina Millard MD Signature Date/Time: 01/22/2019/3:03:43 PM    Final     Scheduled Meds: . amLODipine  5 mg Oral Daily  . aspirin EC  81 mg Oral Daily  . atorvastatin  40 mg Oral QPM  . bisoprolol  10 mg Oral QPM  . clopidogrel  75 mg Oral Daily   . dextromethorphan-guaiFENesin  1 tablet Oral BID  . enoxaparin (LOVENOX) injection  40 mg Subcutaneous Q24H  . folic acid  1 mg Oral Daily  . furosemide      . furosemide  40 mg Intravenous Q12H  . ipratropium-albuterol  3 mL Nebulization Q4H  . ipratropium-albuterol      . LORazepam  0-4 mg Intravenous Q6H   Followed by  . [START ON 01/23/2019] LORazepam  0-4 mg Intravenous Q12H  . multivitamin with minerals  1 tablet Oral Daily  . sodium chloride flush  3 mL Intravenous Q12H  . thiamine  100 mg Oral Daily   Or  . thiamine  100 mg Intravenous Daily   Continuous Infusions: . sodium chloride     PRN Meds:sodium chloride, acetaminophen, albuterol, hydrALAZINE, LORazepam **OR** LORazepam, morphine injection, nitroGLYCERIN, ondansetron (ZOFRAN) IV, sodium chloride flush  Assessment/Plan: Acute respiratory failure with hypoxia due to acute on chronic systolic CHF and elevated trop:  - IV diuresis since admission  - Responding well; ~ -3.1L so far since admission  - Cont iv diuresis  - Patient does not carry history of CHF, but chart review showed that the patient had echo stress test on 07/29/2018 in [facility], which showed EF of 35-40%.  Patient has elevated BNP 633, vascular congestion and cardiomegaly on chest x-ray, 2+ leg edema, clinically consistent with CHF exacerbation.  Patient has a mildly elevated troponin 54, but no chest pain.  Most likely due to demand ischemia secondary to CHF exacerbation. - - on BiPAP -Lasix 40 mg bid by IV - check A1c, FLP and -  UDS --> Negative  -2d echo:  1. Left ventricular ejection fraction, by visual estimation, is 55 to 60%. The left ventricle has normal function. There is mildly increased left ventricular hypertrophy.  2. Global right ventricle has normal systolic function.The right ventricular size is normal. No increase in right ventricular wall thickness. -Daily weights -strict I/O's -Low salt diet -Fluid restriction - Wean off oxygen    Hyperlipidemia: -Cont  lipitor  HTN: Stable  -Continue home medications: Amlodipine, Zebeta -hydralazine prn  OSA (obstructive sleep apnea): -on BiPAP at night   PVD (peripheral vascular disease) (HCC): -on plavix  Alcohol abuse: No S/S of withdrawal so far  -CiWA protocol  CKD (chronic kidney disease), stage IIIa: Improved from admission  -   Baseline creatinine 1.2-1.4.  His creatinine is 1.45, BUN 20. -Follow-up of BMP   LOS: 1 day   Angelissa Supan Harold HedgeAlam

## 2019-01-22 NOTE — OR Nursing (Signed)
Patient transferred from ER on stretcher sitting in high fowlers, on bipap.  Patient alert and oriented transferred to regular hospital bed, weighed patient and placed patient on [place] via 3 liters.  Patient tolerating transition well oxygen saturations maintaining above 92%.  No shortness of breathe noted.

## 2019-01-22 NOTE — Progress Notes (Signed)
*  PRELIMINARY RESULTS* Echocardiogram 2D Echocardiogram has been performed.  Garrel Ridgel Dervin Vore 01/22/2019, 2:50 PM

## 2019-01-22 NOTE — OR Nursing (Signed)
Patient's medications were due early most of them held do to patient taking medications at lunch time today.

## 2019-01-22 NOTE — OR Nursing (Signed)
Patient has had medications, spoke with family on the phone, he had some water and crackers, he has been resting quietly with no shortness of breathe and maintaining oxygen saturations on [place] via 3 liters at 95%. No complaints and resting with eyes closed.

## 2019-01-23 LAB — BASIC METABOLIC PANEL
Anion gap: 9 (ref 5–15)
BUN: 30 mg/dL — ABNORMAL HIGH (ref 8–23)
CO2: 31 mmol/L (ref 22–32)
Calcium: 8.3 mg/dL — ABNORMAL LOW (ref 8.9–10.3)
Chloride: 102 mmol/L (ref 98–111)
Creatinine, Ser: 1.28 mg/dL — ABNORMAL HIGH (ref 0.61–1.24)
GFR calc Af Amer: >60 mL/min (ref >60)
GFR calc non Af Amer: 58 mL/min — ABNORMAL LOW (ref >60)
Glucose, Bld: 137 mg/dL — ABNORMAL HIGH (ref 70–99)
Potassium: 3.6 mmol/L (ref 3.5–5.1)
Sodium: 142 mmol/L (ref 135–145)

## 2019-01-23 MED ORDER — POTASSIUM CHLORIDE CRYS ER 20 MEQ PO TBCR
40.0000 meq | EXTENDED_RELEASE_TABLET | Freq: Once | ORAL | Status: AC
  Administered 2019-01-23: 40 meq via ORAL
  Filled 2019-01-23: qty 2

## 2019-01-23 MED ORDER — IPRATROPIUM-ALBUTEROL 0.5-2.5 (3) MG/3ML IN SOLN
3.0000 mL | Freq: Three times a day (TID) | RESPIRATORY_TRACT | Status: DC
  Administered 2019-01-24: 3 mL via RESPIRATORY_TRACT
  Filled 2019-01-23: qty 3

## 2019-01-23 NOTE — Progress Notes (Addendum)
Pt reports coughing up a small amount of blood. There is no evidence of this, pt threw it away and trash had been taken out. Pt describes it as "a small dark lump, probably been sittin there awhile". Pt asked to notify staff if he experiences more of this in the future.

## 2019-01-23 NOTE — Progress Notes (Signed)
69 y.o. male with medical history significant of hypertension, hyperlipidemia, sCHF with EF of 35-40% (by Echo stress test on 07/29/2018 in [facility]), PVD, OSA, alcohol abuse, who presents with shortness of breath. In the ED pt was found to have BNP 633, negative RVP for COVID-19, troponin 54, WBC 13.8, renal function close to baseline, temperature normal, tachycardia, blood pressure 149/67, tachypnea, chest x-ray showed vascular congestion and cardiomegaly.     Subjective: States feeling much better this morning.  Currently on 2 L supplemental oxygen via nasal cannula through the afternoon.  Patient also complained of some blood-tinged sputum.  Objective: Vital signs in last 24 hours: Temp:  [97.8 F (36.6 C)-99 F (37.2 C)] 98.6 F (37 C) (01/04 1559) Pulse Rate:  [70-79] 73 (01/04 1559) Resp:  [16-18] 16 (01/04 1559) BP: (119-142)/(71-94) 142/94 (01/04 1559) SpO2:  [91 %-99 %] 94 % (01/04 1559)  Intake/Output from previous day: 01/03 0701 - 01/04 0700 In: 540 [P.O.:540] Out: 1850 [Urine:1850] Intake/Output this shift: Total I/O In: 120 [P.O.:120] Out: 1400 [Urine:1400]  General: Not in acute distress HEENT:       Eyes: PERRL, EOMI, no scleral icterus.       ENT: No discharge from the ears and nose, no pharynx injection, no tonsillar enlargement.        Neck: No JVD, no bruit, no mass felt. Heme: No neck lymph node enlargement. Cardiac: S1/S2, RRR, No murmurs, No gallops or rubs. Respiratory:  Coarse breathing; few crackles bilaterally GI: Soft, distended, nontender, no rebound pain, no organomegaly, BS present. GU: No hematuria Ext: 2+ pitting leg edema bilaterally. 2+DP/PT pulse bilaterally. Musculoskeletal: No joint deformities, No joint redness or warmth, no limitation of ROM in spin. Skin: No rashes.  Neuro: Alert, oriented X3, cranial nerves II-XII grossly intact, moves all extremities normally.  Psych: Patient is not psychotic, no suicidal or hemocidal ideation.  Results  for orders placed or performed during the hospital encounter of 01/21/19 (from the past 24 hour(s))  Basic metabolic panel     Status: Abnormal   Collection Time: 01/23/19  6:14 AM  Result Value Ref Range   Sodium 142 135 - 145 mmol/L   Potassium 3.6 3.5 - 5.1 mmol/L   Chloride 102 98 - 111 mmol/L   CO2 31 22 - 32 mmol/L   Glucose, Bld 137 (H) 70 - 99 mg/dL   BUN 30 (H) 8 - 23 mg/dL   Creatinine, Ser 1.28 (H) 0.61 - 1.24 mg/dL   Calcium 8.3 (L) 8.9 - 10.3 mg/dL   GFR calc non Af Amer 58 (L) >60 mL/min   GFR calc Af Amer >60 >60 mL/min   Anion gap 9 5 - 15    Studies/Results: DG Chest Port 1 View  Result Date: 01/21/2019 CLINICAL DATA:  Dyspnea. EXAM: PORTABLE CHEST 1 VIEW COMPARISON:  None FINDINGS: Decreased lung volumes. Cardiac enlargement and pulmonary vascular congestion. No airspace opacities. IMPRESSION: Cardiac enlargement and pulmonary vascular congestion. Electronically Signed   By: Kerby Moors M.D.   On: 01/21/2019 14:46   ECHOCARDIOGRAM COMPLETE  Result Date: 01/22/2019   ECHOCARDIOGRAM REPORT   Patient Name:   VIRGLE ARTH Date of Exam: 01/22/2019 Medical Rec #:  [id number]       Height:       69.0 in Accession #:    [id number]      Weight:       283.0 lb Date of Birth:  07-17-1950       BSA:  2.39 m Patient Age:    68 years        BP:           154/76 mmHg Patient Gender: M               HR:           68 bpm. Exam Location:  [facility] Procedure: 2D Echo Indications:     CHF 428.31/ I50.31  History:         Patient has no prior history of Echocardiogram examinations.  Sonographer:     Wonda Cerise RDCS Referring Phys:  Wynona Neat NIU Diagnosing Phys: Marcina Millard MD  Sonographer Comments: Technically difficult study due to poor echo windows. IMPRESSIONS  1. Left ventricular ejection fraction, by visual estimation, is 55 to 60%. The left ventricle has normal function. There is mildly increased left ventricular hypertrophy.  2. Global right ventricle has normal  systolic function.The right ventricular size is normal. No increase in right ventricular wall thickness.  3. Left atrial size was normal.  4. Right atrial size was normal.  5. The mitral valve is normal in structure. Mild mitral valve regurgitation. No evidence of mitral stenosis.  6. The tricuspid valve is normal in structure.  7. The aortic valve is normal in structure. Aortic valve regurgitation is not visualized. No evidence of aortic valve sclerosis or stenosis.  8. The pulmonic valve was normal in structure. Pulmonic valve regurgitation is not visualized.  9. The inferior vena cava is normal in size with greater than 50% respiratory variability, suggesting right atrial pressure of 3 mmHg. FINDINGS  Left Ventricle: Left ventricular ejection fraction, by visual estimation, is 55 to 60%. The left ventricle has normal function. The left ventricle is not well visualized. There is mildly increased left ventricular hypertrophy. Normal left atrial pressure. Right Ventricle: The right ventricular size is normal. No increase in right ventricular wall thickness. Global RV systolic function is has normal systolic function. Left Atrium: Left atrial size was normal in size. Right Atrium: Right atrial size was normal in size Pericardium: There is no evidence of pericardial effusion. Mitral Valve: The mitral valve is normal in structure. Mild mitral valve regurgitation. No evidence of mitral valve stenosis by observation. Tricuspid Valve: The tricuspid valve is normal in structure. Tricuspid valve regurgitation is mild. Aortic Valve: The aortic valve is normal in structure. Aortic valve regurgitation is not visualized. The aortic valve is structurally normal, with no evidence of sclerosis or stenosis. Aortic valve mean gradient measures 4.0 mmHg. Aortic valve peak gradient measures 8.1 mmHg. Aortic valve area, by VTI measures 2.21 cm. Pulmonic Valve: The pulmonic valve was normal in structure. Pulmonic valve regurgitation is  not visualized. Pulmonic regurgitation is not visualized. Aorta: The aortic root, ascending aorta and aortic arch are all structurally normal, with no evidence of dilitation or obstruction. Venous: The inferior vena cava is normal in size with greater than 50% respiratory variability, suggesting right atrial pressure of 3 mmHg. IAS/Shunts: No atrial level shunt detected by color flow Doppler. There is no evidence of a patent foramen ovale. No ventricular septal defect is seen or detected. There is no evidence of an atrial septal defect.  LEFT VENTRICLE PLAX 2D LVIDd:         5.83 cm  Diastology LVIDs:         4.05 cm  LV e' lateral:   8.59 cm/s LV PW:         1.56 cm  LV E/e' lateral:  12.2 LV IVS:        1.90 cm  LV e' medial:    6.96 cm/s LVOT diam:     2.10 cm  LV E/e' medial:  15.1 LV SV:         96 ml LV SV Index:   37.67 LVOT Area:     3.46 cm  RIGHT VENTRICLE RV Basal diam:  3.02 cm RV S prime:     16.50 cm/s TAPSE (M-mode): 2.6 cm LEFT ATRIUM             Index       RIGHT ATRIUM           Index LA diam:        4.90 cm 2.05 cm/m  RA Area:     22.90 cm LA Vol (A2C):   53.2 ml 22.22 ml/m RA Volume:   69.80 ml  29.15 ml/m LA Vol (A4C):   82.9 ml 34.62 ml/m LA Biplane Vol: 69.2 ml 28.90 ml/m  AORTIC VALVE                   PULMONIC VALVE AV Area (Vmax):    2.25 cm    PV Vmax:       1.10 m/s AV Area (Vmean):   2.20 cm    PV Peak grad:  4.8 mmHg AV Area (VTI):     2.21 cm AV Vmax:           142.00 cm/s AV Vmean:          95.200 cm/s AV VTI:            0.267 m AV Peak Grad:      8.1 mmHg AV Mean Grad:      4.0 mmHg LVOT Vmax:         92.40 cm/s LVOT Vmean:        60.600 cm/s LVOT VTI:          0.170 m LVOT/AV VTI ratio: 0.64  AORTA Ao Root diam: 3.40 cm Ao Asc diam:  3.60 cm MITRAL VALVE MV Area (PHT): 4.15 cm              SHUNTS MV PHT:        53.07 msec            Systemic VTI:  0.17 m MV Decel Time: 183 msec              Systemic Diam: 2.10 cm MV E velocity: 105.00 cm/s 103 cm/s MV A velocity: 55.30  cm/s  70.3 cm/s MV E/A ratio:  1.90        1.5  Marcina Millard MD Electronically signed by Marcina Millard MD Signature Date/Time: 01/22/2019/3:03:43 PM    Final     Scheduled Meds: . amLODipine  5 mg Oral Daily  . aspirin EC  81 mg Oral Daily  . atorvastatin  40 mg Oral QPM  . bisoprolol  10 mg Oral QPM  . clopidogrel  75 mg Oral Daily  . dextromethorphan-guaiFENesin  1 tablet Oral BID  . enoxaparin (LOVENOX) injection  40 mg Subcutaneous Q24H  . folic acid  1 mg Oral Daily  . furosemide  40 mg Intravenous Q12H  . influenza vaccine adjuvanted  0.5 mL Intramuscular Tomorrow-1000  . ipratropium-albuterol  3 mL Nebulization Q4H  . LORazepam  0-4 mg Intravenous Q6H   Followed by  . LORazepam  0-4 mg Intravenous Q12H  . multivitamin with minerals  1  tablet Oral Daily  . pneumococcal 23 valent vaccine  0.5 mL Intramuscular Tomorrow-1000  . sodium chloride flush  3 mL Intravenous Q12H  . thiamine  100 mg Oral Daily   Or  . thiamine  100 mg Intravenous Daily   Continuous Infusions: . sodium chloride     PRN Meds:sodium chloride, acetaminophen, albuterol, hydrALAZINE, LORazepam **OR** LORazepam, morphine injection, nitroGLYCERIN, ondansetron (ZOFRAN) IV, sodium chloride flush  Assessment/Plan: Acute respiratory failure with hypoxia due to acute on chronic systolic CHF and elevated trop:  - IV diuresis since admission ; improving not optimized yet - Responding well; ~ -4L so far since admission  - Cont iv diuresis for tonight; change to oral diuretics in the morning - Patient does not carry history of CHF, but chart review showed that the patient had echo stress test on 07/29/2018 in [facility], which showed EF of 35-40%.  Patient has elevated BNP 633, vascular congestion and cardiomegaly on chest x-ray, 2+ leg edema, clinically consistent with CHF exacerbation.  Patient has a mildly elevated troponin 54, but no chest pain.  Most likely due to demand ischemia secondary to CHF  exacerbation. - - on  BiPAP at night - check A1c 5.3 now, FLP 153 of LDL and 215 of cholesterol -  UDS --> Negative  -2d echo:  1. Left ventricular ejection fraction, by visual estimation, is 55 to 60%. The left ventricle has normal function. There is mildly increased left ventricular hypertrophy.  2. Global right ventricle has normal systolic function.The right ventricular size is normal. No increase in right ventricular wall thickness. -Daily weights -strict I/O's -Low salt diet -Fluid restriction - Wean off oxygen to room air at rest and ambulation  Hyperlipidemia: -Cont lipitor  HTN: Stable  -Continue home medications: Amlodipine, Zebeta -hydralazine prn  OSA (obstructive sleep apnea): -on BiPAP at night   PVD (peripheral vascular disease) (HCC): -on plavix  Alcohol abuse: No S/S of withdrawal so far  -CiWA protocol  CKD (chronic kidney disease), stage IIIa: Improved from admission  -   Baseline creatinine 1.2-1.4.  His creatinine is 1.45, BUN 20. -Follow-up of BMP  Disposition: Anticipated discharge in 24 hours   LOS: 2 days   Honestii Marton Harold Hedge

## 2019-01-24 LAB — BASIC METABOLIC PANEL
Anion gap: 8 (ref 5–15)
BUN: 26 mg/dL — ABNORMAL HIGH (ref 8–23)
CO2: 32 mmol/L (ref 22–32)
Calcium: 8.6 mg/dL — ABNORMAL LOW (ref 8.9–10.3)
Chloride: 103 mmol/L (ref 98–111)
Creatinine, Ser: 1.15 mg/dL (ref 0.61–1.24)
GFR calc Af Amer: >60 mL/min (ref >60)
GFR calc non Af Amer: >60 mL/min (ref >60)
Glucose, Bld: 130 mg/dL — ABNORMAL HIGH (ref 70–99)
Potassium: 3.5 mmol/L (ref 3.5–5.1)
Sodium: 143 mmol/L (ref 135–145)

## 2019-01-24 MED ORDER — FLUTICASONE FUROATE-VILANTEROL 200-25 MCG/INH IN AEPB: 1.0000 | INHALATION_SPRAY | Freq: Two times a day (BID) | RESPIRATORY_TRACT | 0 refills | Status: AC

## 2019-01-24 MED ORDER — FOLIC ACID 1 MG PO TABS: 1.0000 mg | ORAL_TABLET | Freq: Every day | ORAL | 0 refills | Status: AC

## 2019-01-24 MED ORDER — FUROSEMIDE 40 MG PO TABS: 40.0000 mg | ORAL_TABLET | Freq: Every day | ORAL | 0 refills | Status: AC

## 2019-01-24 MED ORDER — THIAMINE HCL 100 MG PO TABS: 100.0000 mg | ORAL_TABLET | Freq: Every day | ORAL | 0 refills | Status: AC

## 2019-01-24 MED ORDER — ADULT MULTIVITAMIN W/MINERALS CH: 1.0000 | ORAL_TABLET | Freq: Every day | ORAL | 0 refills | Status: AC

## 2019-01-24 MED ORDER — POTASSIUM CHLORIDE CRYS ER 20 MEQ PO TBCR
40.0000 meq | EXTENDED_RELEASE_TABLET | Freq: Once | ORAL | Status: AC
  Administered 2019-01-24: 40 meq via ORAL
  Filled 2019-01-24: qty 2

## 2019-01-24 NOTE — Care Management Important Message (Signed)
Important Message  Patient Details  Name: [name] MRN: [id number] Date of Birth: 12/19/1950   Medicare Important Message Given:  Yes     [name] 01/24/2019, 12:41 PM

## 2019-01-24 NOTE — Discharge Instructions (Signed)
Heart Failure, Self Care Heart failure is a serious condition. This sheet explains things you need to do to take care of yourself at home. To help you stay as healthy as possible, you may be asked to change your diet, take certain medicines, and make other changes in your life. Your doctor may also give you more specific instructions. If you have problems or questions, call your doctor. What are the risks? Having heart failure makes it more likely for you to have some problems. These problems can get worse if you do not take good care of yourself. Problems may include:  Blood clotting problems. This may cause a stroke.  Damage to the kidneys, liver, or lungs.  Abnormal heart rhythms. Supplies needed:  Scale for weighing yourself.  Blood pressure monitor.  Notebook.  Medicines. How to care for yourself when you have heart failure Medicines Take over-the-counter and prescription medicines only as told by your doctor. Take your medicines every day.  Do not stop taking your medicine unless your doctor tells you to do so.  Do not skip any medicines.  Get your prescriptions refilled before you run out of medicine. This is important. Eating and drinking   Eat heart-healthy foods. Talk with a diet specialist (dietitian) to create an eating plan.  Choose foods that: ? Have no trans fat. ? Are low in saturated fat and cholesterol.  Choose healthy foods, such as: ? Fresh or frozen fruits and vegetables. ? Fish. ? Low-fat (lean) meats. ? Legumes, such as beans, peas, and lentils. ? Fat-free or low-fat dairy products. ? Whole-grain foods. ? High-fiber foods.  Limit salt (sodium) if told by your doctor. Ask your diet specialist to tell you which seasonings are healthy for your heart.  Cook in healthy ways instead of frying. Healthy ways of cooking include roasting, grilling, broiling, baking, poaching, steaming, and stir-frying.  Limit how much fluid you drink, if told by your  doctor. Alcohol use  Do not drink alcohol if: ? Your doctor tells you not to drink. ? Your heart was damaged by alcohol, or you have very bad heart failure. ? You are pregnant, may be pregnant, or are planning to become pregnant.  If you drink alcohol: ? Limit how much you use to:  0-1 drink a day for women.  0-2 drinks a day for men. ? Be aware of how much alcohol is in your drink. In the [place] one drink equals one 12 oz bottle of beer (355 mL), one 5 oz glass of wine (148 mL), or one 1 oz glass of hard liquor (44 mL). Lifestyle   Do not use any products that contain nicotine or tobacco, such as cigarettes, e-cigarettes, and chewing tobacco. If you need help quitting, ask your doctor. ? Do not use nicotine gum or patches before talking to your doctor.  Do not use illegal drugs.  Lose weight if told by your doctor.  Do physical activity if told by your doctor. Talk to your doctor before you begin an exercise if: ? You are an older adult. ? You have very bad heart failure.  Learn to manage stress. If you need help, ask your doctor.  Get rehab (rehabilitation) to help you stay independent and to help with your quality of life.  Plan time to rest when you get tired. Check weight and blood pressure   Weigh yourself every day. This will help you to know if fluid is building up in your body. ? Weigh yourself every morning   after you pee (urinate) and before you eat breakfast. ? Wear the same amount of clothing each time. ? Write down your daily weight. Give your record to your doctor.  Check and write down your blood pressure as told by your doctor.  Check your pulse as told by your doctor. Dealing with very hot and very cold weather  If it is very hot: ? Avoid activities that take a lot of energy. ? Use air conditioning or fans, or find a cooler place. ? Avoid caffeine and alcohol. ? Wear clothing that is loose-fitting, lightweight, and light-colored.  If it is very  cold: ? Avoid activities that take a lot of energy. ? Layer your clothes. ? Wear mittens or gloves, a hat, and a scarf when you go outside. ? Avoid alcohol. Follow these instructions at home:  Stay up to date with shots (vaccines). Get pneumococcal and flu (influenza) shots.  Keep all follow-up visits as told by your doctor. This is important. Contact a doctor if:  You gain weight quickly.  You have increasing shortness of breath.  You cannot do your normal activities.  You get tired easily.  You cough a lot.  You don't feel like eating or feel like you may vomit (nauseous).  You become puffy (swell) in your hands, feet, ankles, or belly (abdomen).  You cannot sleep well because it is hard to breathe.  You feel like your heart is beating fast (palpitations).  You get dizzy when you stand up. Get help right away if:  You have trouble breathing.  You or someone else notices a change in your behavior, such as having trouble staying awake.  You have chest pain or discomfort.  You pass out (faint). These symptoms may be an emergency. Do not wait to see if the symptoms will go away. Get medical help right away. Call your local emergency services (911 in the [place] Do not drive yourself to the hospital. Summary  Heart failure is a serious condition. To care for yourself, you may have to change your diet, take medicines, and make other lifestyle changes.  Take your medicines every day. Do not stop taking them unless your doctor tells you to do so.  Eat heart-healthy foods, such as fresh or frozen fruits and vegetables, fish, lean meats, legumes, fat-free or low-fat dairy products, and whole-grain or high-fiber foods.  Ask your doctor if you can drink alcohol. You may have to stop alcohol use if you have very bad heart failure.  Contact your doctor if you gain weight quickly or feel that your heart is beating too fast. Get help right away if you pass out, or have chest pain  or trouble breathing. This information is not intended to replace advice given to you by your health care provider. Make sure you discuss any questions you have with your health care provider. Document Revised: 04/19/2018 Document Reviewed: 04/20/2018 Elsevier Patient Education  2020 [employer]  

## 2019-01-24 NOTE — Plan of Care (Signed)
  Problem: Education: Goal: Knowledge of General Education information will improve Description: Including pain rating scale, medication(s)/side effects and non-pharmacologic comfort measures Outcome: Adequate for Discharge   Problem: Health Behavior/Discharge Planning: Goal: Ability to manage health-related needs will improve Outcome: Adequate for Discharge   Problem: Clinical Measurements: Goal: Ability to maintain clinical measurements within normal limits will improve Outcome: Adequate for Discharge Goal: Will remain free from infection Outcome: Adequate for Discharge Goal: Diagnostic test results will improve Outcome: Adequate for Discharge Goal: Respiratory complications will improve Outcome: Adequate for Discharge Goal: Cardiovascular complication will be avoided Outcome: Adequate for Discharge   Problem: Coping: Goal: Level of anxiety will decrease Outcome: Adequate for Discharge   Problem: Cardiac: Goal: Ability to achieve and maintain adequate cardiopulmonary perfusion will improve Outcome: Adequate for Discharge

## 2019-01-24 NOTE — Progress Notes (Signed)
Patient given discharge instructions. IV taken out and tele monitor off. Education gone over heart failure and the importance of daily weights, diet instructions, fluid restriction,and exercise. CHF packet also given with magnet. Patient verbalized understanding.

## 2019-01-24 NOTE — TOC Transition Note (Signed)
Transition of Care [facility]) - CM/SW Discharge Note   Patient Details  Name: [name] MRN: [id number] Date of Birth: [birth year]  [facility] CM/SW Contact:  Victorino Dike, RN Phone Number: 01/24/2019, 1:05 PM   Clinical Narrative:      Met with patient for HF screen.  Patient has scales at home and discussed importance of weighing self daily and reporting sudden weight gain to his physician.  He reports receiving blood pressure cuff from [place], he understands how to check his own blood pressure and parameters to call his physician.  Patient reports no need other needs at this time.  No further TOC needs at this time, please re-consult for new needs.   Final next level of care: Home/Self Care Barriers to Discharge: Barriers Resolved   Patient Goals and CMS Choice        Discharge Placement                       Discharge Plan and Services                DME Arranged: N/A DME Agency: NA       HH Arranged: NA HH Agency: NA        Social Determinants of Health (SDOH) Interventions     Readmission Risk Interventions No flowsheet data found.

## 2019-01-24 NOTE — Discharge Summary (Signed)
Physician Discharge Summary  Patient ID: IZEN PETZ MRN: [id number] DOB/AGE: 02-22-1950 69 y.o.  Admit date: 01/21/2019 Discharge date: 01/24/2019  Admission Diagnoses:  Discharge Diagnoses:  Principal Problem:   Acute on chronic systolic CHF (congestive heart failure) (HCC) Active Problems:   Hyperlipidemia   Hypertension   OSA (obstructive sleep apnea)   PVD (peripheral vascular disease) (HCC)   Acute respiratory failure with hypoxia (HCC)   Elevated troponin   Acute on chronic systolic (congestive) heart failure (HCC)   Alcohol abuse   CKD (chronic kidney disease), stage IIIa   Discharged Condition: fair  Hospital Course:  69 y.o.malewith medical history significant ofhypertension, hyperlipidemia,sCHF with EF of35-40% (by Echo stress test on 7/10/2020in [facility]), PVD, OSA, alcohol abuse, who presents with shortness of breath. In the ED pt was found to have BNP 633, negative RVP for COVID-19, troponin 54, WBC 13.8, renal function close to baseline, temperature normal, tachycardia, blood pressure 149/67, tachypnea, chest x-ray showed vascular congestion and cardiomegaly.   Acute respiratory failure with hypoxiadue to acute on chronic systolic CHFand elevated trop:Resolved -Data's post IV diuresis since admission ; is to oral Lasix daily - Responding well; patient is about 4 to 5 L negatively balance since admission his weight dropped down to 125 from admission 129 kg -  stress test on 07/29/2018 in [facility], which showed EF of 35-40%. Patient has elevated BNP 633,  - - on  BiPAP at night - check A1c 5.3 now, FLP 153 of LDL and 215 of cholesterol -  UDS --> Negative  -2d echo this admission: 1. Left ventricular ejection fraction, by visual estimation, is 55 to 60%. The left ventricle has normal function. There is mildly increased left ventricular hypertrophy. 2. Global right ventricle has normal systolic function.The right ventricular size is normal. No increase in right  ventricular wall thickness. -Low salt diet -Fluid restriction -Successfully able to wean off oxygen supplemental oxygen to room air and patient maintain saturation well above 90% on ambulation and at rest. -Was strongly advised to follow-up with cardiologist and follow-up referral was given  Hyperlipidemia: -Cont lipitor  HTN: Stable  -Continue home medications:Amlodipine,Zebeta  OSA (obstructive sleep apnea): -on BiPAP at night   PVD (peripheral vascular disease) (HCC): Stable -on plavix  Alcohol abuse: No S/S of withdrawal   -Provided counseling on cessation -Vitamins Rx given  CKD (chronic kidney disease), stage IIIa:Improved from admission  -  Baseline creatinine 1.2-1.4. His creatinine is 1.45, BUN 20.  Consults: None  Significant Diagnostic Studies: Echocardiogram, radiologic imaging and blood test  Treatments: Hospital course and discharge med list  Discharge Exam: Blood pressure (!) 150/89, pulse 73, temperature 98 F (36.7 C), temperature source Oral, resp. rate 18, height 5\' 10"  (1.778 m), weight 125.1 kg, SpO2 95 %.  General:Not in acute distress HEENT: Eyes: PERRL, EOMI, no scleral icterus. ENT: No discharge from the ears and nose, no pharynx injection, no tonsillar enlargement.  Neck:No JVD, no bruit, no mass felt. Heme:No neck lymph node enlargement. Cardiac:S1/S2, RRR, No murmurs, No gallops or rubs. Respiratory:Mostly clear auscultation bilaterally , distended, nontender, no rebound pain, no organomegaly, BS present. GU: No hematuria YT:KPTW edema edema bilaterally. 2+DP/PT pulse bilaterally. Musculoskeletal:No joint deformities, No joint redness or warmth, no limitation of ROM in spin. Skin: No rashes.  Neuro: Alert, oriented X3, cranial nerves II-XII grossly intact, moves all extremities normally.  Psych:Patient is not psychotic, no suicidal or hemocidal ideation.  Disposition: Discharge disposition:  01-Home or Self Care  Patient is a stable to be discharged home with close follow-up PCP and cardiology.  Appointment referral also made.  Warning signs and symptoms explained to patient when he must seek immediate medical attention.  Patient expressed understanding.  Discharge Instructions    Call MD for:  difficulty breathing, headache or visual disturbances   Complete by: As directed    Diet - low sodium heart healthy   Complete by: As directed    Increase activity slowly   Complete by: As directed      Allergies as of 01/24/2019   No Known Allergies     Medication List    TAKE these medications   amLODipine 10 MG tablet Commonly known as: NORVASC Take 5 mg by mouth daily.   aspirin EC 81 MG tablet Take 1 tablet (81 mg total) by mouth daily.   atorvastatin 40 MG tablet Commonly known as: LIPITOR Take 40 mg by mouth every evening.   bisoprolol 10 MG tablet Commonly known as: ZEBETA Take 10 mg by mouth every evening.   clopidogrel 75 MG tablet Commonly known as: Plavix Take 1 tablet (75 mg total) by mouth daily.   fluticasone furoate-vilanterol 200-25 MCG/INH Aepb Commonly known as: BREO ELLIPTA Inhale 1 puff into the lungs 2 (two) times daily.   folic acid 1 MG tablet Commonly known as: FOLVITE Take 1 tablet (1 mg total) by mouth daily. Start taking on: January 25, 2019   furosemide 40 MG tablet Commonly known as: Lasix Take 1 tablet (40 mg total) by mouth daily.   multivitamin with minerals Tabs tablet Take 1 tablet by mouth daily. Start taking on: January 25, 2019   potassium chloride SA 20 MEQ tablet Commonly known as: KLOR-CON Take 40 mEq by mouth daily.   sildenafil 25 MG tablet Commonly known as: VIAGRA Take 25 mg by mouth as needed for erectile dysfunction.   THERAWORX RELIEF EX Apply 1 application topically daily as needed (pain relief).   thiamine 100 MG tablet Take 1 tablet (100 mg total) by mouth daily. Start taking on: January 25, 2019      Follow-up Information    [place] Follow up on 01/30/2019.   Specialty: Cardiology Why: at 11:00am. Enter through the [place] entrance Contact information: [address] [place] [phone]       Marinda Elk, MD. Go on 01/30/2019.   Specialty: Physician Assistant Why: appointment at 10:45am Contact information: [place] [address] [phone]           Signed: Thornell Mule 01/24/2019, 1:38 PM

## 2019-01-25 ENCOUNTER — Telehealth: Payer: Self-pay | Admitting: Family

## 2019-01-25 NOTE — Telephone Encounter (Signed)
LVM to follow up with patient since recent dc from hospital and to confirm his new patient appt we made for 1/12.     Deetta Perla, [place]

## 2019-01-28 NOTE — Progress Notes (Deleted)
   Patient ID: DAIQUAN RESNIK, male    DOB: October 07, 1950, 69 y.o.   MRN: [id number]  HPI  Mr Skillern is a 69 y/o male with a history of  Echo report from 01/22/19 reviewed and showed an EF of 55-60% along with mild MR.   Catheterization done 08/11/2018 and showed: Left ventricular function is normal with ejection fraction of 40%. And global hypokinesis Normal coronary arteries with 0% stenosis  Admitted 01/21/19 due to acute on chronic HF. Initially given IV lasix and then transitioned to oral diuretics. Discharged after 3 days.   Patient presents for his initial visit  Review of Systems    Physical Exam  Assessment & Plan:  1: Chronic heart failure with preserved ejection fraction- - NYHA class - BNP 01/21/19 was 633.0  2: HTN- - BP - BMP 01/24/19 reviewed and showed sodium 142, potassium 3.5, creatinine 1.15 and GFR >60  3: Obstructive sleep apnea- - wearing bipap  4: Alcohol use-

## 2019-01-30 ENCOUNTER — Ambulatory Visit: Payer: Non-veteran care | Admitting: Family

## 2019-01-30 ENCOUNTER — Telehealth: Payer: Self-pay | Admitting: Family

## 2019-01-30 NOTE — Telephone Encounter (Signed)
Patient did not show for his [facility] appointment on 01/30/19. Will attempt to reschedule.  

## 2019-10-02 ENCOUNTER — Other Ambulatory Visit: Payer: Self-pay | Admitting: Cardiology

## 2019-10-02 DIAGNOSIS — I5022 Chronic systolic (congestive) heart failure: Secondary | ICD-10-CM

## 2019-10-18 ENCOUNTER — Other Ambulatory Visit (INDEPENDENT_AMBULATORY_CARE_PROVIDER_SITE_OTHER): Payer: Self-pay | Admitting: Nurse Practitioner

## 2019-10-18 DIAGNOSIS — Z9862 Peripheral vascular angioplasty status: Secondary | ICD-10-CM

## 2019-10-18 DIAGNOSIS — I723 Aneurysm of iliac artery: Secondary | ICD-10-CM

## 2019-10-18 DIAGNOSIS — I70213 Atherosclerosis of native arteries of extremities with intermittent claudication, bilateral legs: Secondary | ICD-10-CM

## 2019-10-24 ENCOUNTER — Encounter (INDEPENDENT_AMBULATORY_CARE_PROVIDER_SITE_OTHER): Payer: Medicare Other

## 2019-10-24 ENCOUNTER — Ambulatory Visit (INDEPENDENT_AMBULATORY_CARE_PROVIDER_SITE_OTHER): Payer: Medicare Other | Admitting: Vascular Surgery

## 2019-11-08 ENCOUNTER — Telehealth: Payer: Self-pay

## 2019-11-08 NOTE — Telephone Encounter (Signed)
Received a referral for an ECHOCARDIOGRAM from the [place]  [facility] to schedule

## 2019-11-16 NOTE — Telephone Encounter (Signed)
LM[facility]  

## 2019-11-17 ENCOUNTER — Other Ambulatory Visit: Payer: Self-pay

## 2019-11-17 ENCOUNTER — Ambulatory Visit (INDEPENDENT_AMBULATORY_CARE_PROVIDER_SITE_OTHER): Payer: No Typology Code available for payment source

## 2019-11-17 ENCOUNTER — Ambulatory Visit (INDEPENDENT_AMBULATORY_CARE_PROVIDER_SITE_OTHER): Payer: No Typology Code available for payment source | Admitting: Vascular Surgery

## 2019-11-17 ENCOUNTER — Encounter (INDEPENDENT_AMBULATORY_CARE_PROVIDER_SITE_OTHER): Payer: Self-pay | Admitting: Vascular Surgery

## 2019-11-17 VITALS — BP 174/94 | HR 79 | Ht 70.0 in | Wt 275.0 lb

## 2019-11-17 DIAGNOSIS — I723 Aneurysm of iliac artery: Secondary | ICD-10-CM | POA: Diagnosis not present

## 2019-11-17 DIAGNOSIS — I70213 Atherosclerosis of native arteries of extremities with intermittent claudication, bilateral legs: Secondary | ICD-10-CM

## 2019-11-17 DIAGNOSIS — I77811 Abdominal aortic ectasia: Secondary | ICD-10-CM | POA: Diagnosis not present

## 2019-11-17 DIAGNOSIS — Z9862 Peripheral vascular angioplasty status: Secondary | ICD-10-CM | POA: Diagnosis not present

## 2019-11-17 DIAGNOSIS — I10 Essential (primary) hypertension: Secondary | ICD-10-CM

## 2019-11-17 NOTE — Assessment & Plan Note (Signed)
2.6 cm.  Being checked with his PAD

## 2019-11-17 NOTE — Assessment & Plan Note (Signed)
His aorta today measured 2.6 cm in maximal diameter on duplex.  His ABIs were 1.06 on the right and 0.93 on the left with multiphasic waveforms.  Symptoms are much better after revascularization.  Continue to check annually.  No change in medical regimen.

## 2019-11-17 NOTE — Progress Notes (Signed)
MRN : [id number]  [name] is a 69 y.o. (04/28/1950) male who presents with chief complaint of  Chief Complaint  Patient presents with  . Follow-up    1year U/S follow up  .  History of Present Illness: Patient returns today in follow up of patient returns in follow-up of his PAD and aortic ectasia.  He has undergone previous lower extremity revascularization with good results.  He is no longer having lifestyle limiting claudication or rest pain.  He does have some arthritic changes in his back that have caused some sciatica pain, but overall his legs are doing much better.  His aorta today measured 2.6 cm in maximal diameter on duplex.  His ABIs were 1.06 on the right and 0.93 on the left with multiphasic waveforms.  Current Outpatient Medications  Medication Sig Dispense Refill  . amLODipine (NORVASC) 10 MG tablet Take 5 mg by mouth daily.     . Ascorbic Acid (VITAMIN C) 100 MG tablet Take 100 mg by mouth daily.    [name] aspirin EC 81 MG tablet Take 1 tablet (81 mg total) by mouth daily. 150 tablet 2  . atorvastatin (LIPITOR) 40 MG tablet Take 40 mg by mouth every evening.     . clopidogrel (PLAVIX) 75 MG tablet Take 1 tablet (75 mg total) by mouth daily. 30 tablet 11  . fluticasone furoate-vilanterol (BREO ELLIPTA) 200-25 MCG/INH AEPB Inhale 1 puff into the lungs 2 (two) times daily. 60 each 0  . furosemide (LASIX) 40 MG tablet Take 1 tablet (40 mg total) by mouth daily. 30 tablet 0  . Homeopathic Products [facility] RELIEF EX) Apply 1 application topically daily as needed (pain relief).    . potassium chloride SA (K-DUR,KLOR-CON) 20 MEQ tablet Take 40 mEq by mouth daily.     . sildenafil (VIAGRA) 25 MG tablet Take 25 mg by mouth as needed for erectile dysfunction.     . vitamin B-12 (CYANOCOBALAMIN) 500 MCG tablet Take 500 mcg by mouth daily.    . bisoprolol (ZEBETA) 10 MG tablet Take 10 mg by mouth every evening.    . folic acid (FOLVITE) 1 MG tablet Take 1 tablet (1 mg total) by  mouth daily. (Patient not taking: Reported on 11/17/2019) 30 tablet 0  . Multiple Vitamin (MULTIVITAMIN WITH MINERALS) TABS tablet Take 1 tablet by mouth daily. (Patient not taking: Reported on 11/17/2019) 30 tablet 0  . sildenafil (REVATIO) 20 MG tablet Take 20 mg by mouth daily. (Patient not taking: Reported on 11/17/2019)    . thiamine 100 MG tablet Take 1 tablet (100 mg total) by mouth daily. (Patient not taking: Reported on 11/17/2019) 30 tablet 0   No current facility-administered medications for this visit.    Past Medical History:  Diagnosis Date  . Arthritis   . Hyperlipidemia   . Hypertension   . Peripheral vascular disease [facility])     Past Surgical History:  Procedure Laterality Date  . ESOPHAGOGASTRODUODENOSCOPY    . HERNIA REPAIR    . LEFT HEART CATH AND CORONARY ANGIOGRAPHY Left 08/11/2018   Procedure: LEFT HEART CATH AND CORONARY ANGIOGRAPHY;  Surgeon: Lamar Blinks, MD;  Location: [facility] INVASIVE CV LAB;  Service: Cardiovascular;  Laterality: Left;  . LOWER EXTREMITY ANGIOGRAPHY Right 01/24/2018   Procedure: LOWER EXTREMITY ANGIOGRAPHY;  Surgeon: Annice Needy, MD;  Location: [facility] INVASIVE CV LAB;  Service: Cardiovascular;  Laterality: Right;     Social History   Tobacco Use  . Smoking status: Never  Smoker  . Smokeless tobacco: Never Used  Vaping Use  . Vaping Use: Never used  Substance Use Topics  . Alcohol use: Yes    Alcohol/week: 8.0 - 12.0 standard drinks    Types: 8 - 12 Shots of liquor per week  . Drug use: Never       Family History  Problem Relation Age of Onset  . Uterine cancer Mother      No Known Allergies   REVIEW OF SYSTEMS (Negative unless checked)  Constitutional: [] Weight loss  [] Fever  [] Chills Cardiac: [] Chest pain   [] Chest pressure   [] Palpitations   [] Shortness of breath when laying flat   [] Shortness of breath at rest   [] Shortness of breath with exertion. Vascular:  [] Pain in legs with walking   [] Pain in legs at rest    [] Pain in legs when laying flat   [x] Claudication   [] Pain in feet when walking  [] Pain in feet at rest  [] Pain in feet when laying flat   [] History of DVT   [] Phlebitis   [] Swelling in legs   [] Varicose veins   [] Non-healing ulcers Pulmonary:   [] Uses home oxygen   [] Productive cough   [] Hemoptysis   [] Wheeze  [] COPD   [] Asthma Neurologic:  [] Dizziness  [] Blackouts   [] Seizures   [] History of stroke   [] History of TIA  [] Aphasia   [] Temporary blindness   [] Dysphagia   [] Weakness or numbness in arms   [] Weakness or numbness in legs Musculoskeletal:  [x] Arthritis   [] Joint swelling   [] Joint pain   [x] Low back pain Hematologic:  [] Easy bruising  [] Easy bleeding   [] Hypercoagulable state   [] Anemic   Gastrointestinal:  [] Blood in stool   [] Vomiting blood  [] Gastroesophageal reflux/heartburn   [] Abdominal pain Genitourinary:  [] Chronic kidney disease   [] Difficult urination  [] Frequent urination  [] Burning with urination   [] Hematuria Skin:  [] Rashes   [] Ulcers   [] Wounds Psychological:  [] History of anxiety   []  History of major depression.  Physical Examination  BP (!) 174/94   Pulse 79   Ht 5\' 10"  (1.778 m)   Wt 275 lb (124.7 kg)   BMI 39.46 kg/m  Gen:  WD/WN, NAD Head: [place]/AT, No temporalis wasting. Ear/Nose/Throat: Hearing grossly intact, nares w/o erythema or drainage Eyes: Conjunctiva clear. Sclera non-icteric Neck: Supple.  Trachea midline Pulmonary:  Good air movement, no use of accessory muscles.  Cardiac: RRR, no JVD Vascular:  Vessel Right Left  Radial Palpable Palpable                          PT Palpable Palpable  DP Palpable Palpable   Gastrointestinal: soft, non-tender/non-distended. No guarding/reflex.  Musculoskeletal: M/S 5/5 throughout.  No deformity or atrophy. Trace LE edema. Neurologic: Sensation grossly intact in extremities.  Symmetrical.  Speech is fluent.  Psychiatric: Judgment intact, Mood & affect appropriate for pt's clinical situation. Dermatologic:  No rashes or ulcers noted.  No cellulitis or open wounds.       Labs No results found for this or any previous visit (from the past 2160 hour(s)).  Radiology No results found.  Assessment/Plan  Atherosclerosis of native arteries of extremity with intermittent claudication (HCC) His aorta today measured 2.6 cm in maximal diameter on duplex.  His ABIs were 1.06 on the right and 0.93 on the left with multiphasic waveforms.  Symptoms are much better after revascularization.  Continue to check annually.  No change in medical regimen.  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  Being checked with his PAD  Hypertension blood pressure control important in reducing the progression of atherosclerotic disease. On appropriate oral medications.     Festus Barren, MD  11/17/2019 12:17 PM    This note was created with Dragon medical transcription system.  Any errors from dictation are purely unintentional

## 2019-11-17 NOTE — Assessment & Plan Note (Signed)
blood pressure control important in reducing the progression of atherosclerotic disease. On appropriate oral medications.  

## 2020-06-07 ENCOUNTER — Other Ambulatory Visit: Payer: Self-pay | Admitting: Family Medicine

## 2020-06-07 DIAGNOSIS — M4807 Spinal stenosis, lumbosacral region: Secondary | ICD-10-CM

## 2020-06-19 ENCOUNTER — Other Ambulatory Visit: Payer: Non-veteran care

## 2020-06-20 ENCOUNTER — Ambulatory Visit: Admission: RE | Admit: 2020-06-20 | Payer: No Typology Code available for payment source | Source: Ambulatory Visit

## 2020-11-13 ENCOUNTER — Other Ambulatory Visit: Payer: Self-pay | Admitting: Physician Assistant

## 2020-11-13 ENCOUNTER — Other Ambulatory Visit: Payer: Self-pay | Admitting: Family Medicine

## 2020-11-13 DIAGNOSIS — M4807 Spinal stenosis, lumbosacral region: Secondary | ICD-10-CM

## 2020-11-13 DIAGNOSIS — M5136 Other intervertebral disc degeneration, lumbar region: Secondary | ICD-10-CM

## 2020-11-15 ENCOUNTER — Ambulatory Visit (INDEPENDENT_AMBULATORY_CARE_PROVIDER_SITE_OTHER): Payer: No Typology Code available for payment source

## 2020-11-15 ENCOUNTER — Other Ambulatory Visit: Payer: Self-pay

## 2020-11-15 ENCOUNTER — Encounter (INDEPENDENT_AMBULATORY_CARE_PROVIDER_SITE_OTHER): Payer: Self-pay | Admitting: Vascular Surgery

## 2020-11-15 ENCOUNTER — Ambulatory Visit (INDEPENDENT_AMBULATORY_CARE_PROVIDER_SITE_OTHER): Payer: No Typology Code available for payment source | Admitting: Vascular Surgery

## 2020-11-15 VITALS — BP 194/102 | HR 78 | Resp 16 | Wt 280.0 lb

## 2020-11-15 DIAGNOSIS — I77811 Abdominal aortic ectasia: Secondary | ICD-10-CM | POA: Diagnosis not present

## 2020-11-15 DIAGNOSIS — I70213 Atherosclerosis of native arteries of extremities with intermittent claudication, bilateral legs: Secondary | ICD-10-CM

## 2020-11-15 DIAGNOSIS — I10 Essential (primary) hypertension: Secondary | ICD-10-CM | POA: Diagnosis not present

## 2020-11-15 DIAGNOSIS — E785 Hyperlipidemia, unspecified: Secondary | ICD-10-CM

## 2020-11-15 NOTE — Assessment & Plan Note (Signed)
blood pressure control important in reducing the progression of atherosclerotic disease. On appropriate oral medications.  

## 2020-11-15 NOTE — Assessment & Plan Note (Signed)
Aortic diameter stable at 2.6 cm.  We are checking aortoiliac duplex due to his peripheral arterial disease on an annual basis and will continue this.

## 2020-11-15 NOTE — Assessment & Plan Note (Signed)
lipid control important in reducing the progression of atherosclerotic disease. Continue statin therapy  

## 2020-11-15 NOTE — Progress Notes (Signed)
MRN : [id number]  [name] is a 70 y.o. (12-22-1950) male who presents with chief complaint of  Chief Complaint  Patient presents with   Follow-up    Ultrasound follow up  .  History of Present Illness: Patient returns today in follow up of aortic ectasia and PAD.  He still having a fair bit of hip pain particularly with walking.  No ulceration or infection.  No rest pain.  No aneurysm related symptoms. ABIs today are stable at 1.0 on the right and 0.89 on the left with multiphasic waveforms and normal digital pressures.  The abdominal aortic maximal diameter was stable at 2.6 cm.   Current Outpatient Medications  Medication Sig Dispense Refill   Ascorbic Acid (VITAMIN C) 100 MG tablet Take 100 mg by mouth daily.     ascorbic acid (VITAMIN C) 500 MG tablet Take 2 tablets by mouth daily.     aspirin EC 81 MG tablet Take 1 tablet (81 mg total) by mouth daily. 150 tablet 2   atorvastatin (LIPITOR) 80 MG tablet Take 80 mg by mouth every evening.     carvedilol (COREG) 12.5 MG tablet TAKE ONE TABLET BY MOUTH TWO TIMES A DAY FOR BLOOD PRESSURE FOR THE HEART AND BLOOD PRESSURE (NOTE DOSE CHANGE)     empagliflozin (JARDIANCE) 25 MG TABS tablet TAKE ONE-HALF TABLET BY MOUTH ONCE EVERY DAY FOR THE HEART     furosemide (LASIX) 40 MG tablet Take 1 tablet (40 mg total) by mouth daily. 30 tablet 0   Homeopathic Products (THERAWORX RELIEF EX) Apply 1 application topically daily as needed (pain relief).     Omega 3 1000 MG CAPS Take by mouth daily.     sacubitril-valsartan (ENTRESTO) 49-51 MG Take by mouth.     sildenafil (VIAGRA) 25 MG tablet Take 25 mg by mouth as needed for erectile dysfunction.      spironolactone (ALDACTONE) 25 MG tablet Take 1 tablet by mouth daily.     vitamin B-12 (CYANOCOBALAMIN) 500 MCG tablet Take 500 mcg by mouth daily.     amLODipine (NORVASC) 10 MG tablet Take 5 mg by mouth daily.  (Patient not taking: Reported on 11/15/2020)     bisoprolol (ZEBETA) 10 MG tablet  Take 10 mg by mouth every evening.     clopidogrel (PLAVIX) 75 MG tablet Take 1 tablet (75 mg total) by mouth daily. (Patient not taking: Reported on 11/15/2020) 30 tablet 11   fluticasone furoate-vilanterol (BREO ELLIPTA) 200-25 MCG/INH AEPB Inhale 1 puff into the lungs 2 (two) times daily. (Patient not taking: Reported on 11/15/2020) 60 each 0   folic acid (FOLVITE) 1 MG tablet Take 1 tablet (1 mg total) by mouth daily. (Patient not taking: No sig reported) 30 tablet 0   Multiple Vitamin (MULTIVITAMIN WITH MINERALS) TABS tablet Take 1 tablet by mouth daily. (Patient not taking: No sig reported) 30 tablet 0   potassium chloride SA (K-DUR,KLOR-CON) 20 MEQ tablet Take 40 mEq by mouth daily.  (Patient not taking: Reported on 11/15/2020)     sildenafil (REVATIO) 20 MG tablet Take 20 mg by mouth daily. (Patient not taking: No sig reported)     thiamine 100 MG tablet Take 1 tablet (100 mg total) by mouth daily. (Patient not taking: No sig reported) 30 tablet 0   No current facility-administered medications for this visit.    Past Medical History:  Diagnosis Date   Arthritis    Hyperlipidemia    Hypertension    Peripheral  vascular disease [facility])     Past Surgical History:  Procedure Laterality Date   ESOPHAGOGASTRODUODENOSCOPY     HERNIA REPAIR     LEFT HEART CATH AND CORONARY ANGIOGRAPHY Left 08/11/2018   Procedure: LEFT HEART CATH AND CORONARY ANGIOGRAPHY;  Surgeon: Lamar Blinks, MD;  Location: [facility] INVASIVE CV LAB;  Service: Cardiovascular;  Laterality: Left;   LOWER EXTREMITY ANGIOGRAPHY Right 01/24/2018   Procedure: LOWER EXTREMITY ANGIOGRAPHY;  Surgeon: Annice Needy, MD;  Location: [facility] INVASIVE CV LAB;  Service: Cardiovascular;  Laterality: Right;     Social History   Tobacco Use   Smoking status: Never   Smokeless tobacco: Never  Vaping Use   Vaping Use: Never used  Substance Use Topics   Alcohol use: Yes    Alcohol/week: 8.0 - 12.0 standard drinks    Types: 8 - 12 Shots  of liquor per week   Drug use: Never      Family History  Problem Relation Age of Onset   Uterine cancer Mother      No Known Allergies  REVIEW OF SYSTEMS (Negative unless checked)   Constitutional: [] Weight loss  [] Fever  [] Chills Cardiac: [] Chest pain   [] Chest pressure   [] Palpitations   [] Shortness of breath when laying flat   [] Shortness of breath at rest   [] Shortness of breath with exertion. Vascular:  [] Pain in legs with walking   [] Pain in legs at rest   [] Pain in legs when laying flat   [x] Claudication   [] Pain in feet when walking  [] Pain in feet at rest  [] Pain in feet when laying flat   [] History of DVT   [] Phlebitis   [] Swelling in legs   [] Varicose veins   [] Non-healing ulcers Pulmonary:   [] Uses home oxygen   [] Productive cough   [] Hemoptysis   [] Wheeze  [] COPD   [] Asthma Neurologic:  [] Dizziness  [] Blackouts   [] Seizures   [] History of stroke   [] History of TIA  [] Aphasia   [] Temporary blindness   [] Dysphagia   [] Weakness or numbness in arms   [] Weakness or numbness in legs Musculoskeletal:  [x] Arthritis   [] Joint swelling   [] Joint pain   [x] Low back pain Hematologic:  [] Easy bruising  [] Easy bleeding   [] Hypercoagulable state   [] Anemic   Gastrointestinal:  [] Blood in stool   [] Vomiting blood  [] Gastroesophageal reflux/heartburn   [] Abdominal pain Genitourinary:  [] Chronic kidney disease   [] Difficult urination  [] Frequent urination  [] Burning with urination   [] Hematuria Skin:  [] Rashes   [] Ulcers   [] Wounds Psychological:  [] History of anxiety   []  History of major depression.  Physical Examination  BP (!) 194/102 (BP Location: Right Arm)   Pulse 78   Resp 16   Wt 280 lb (127 kg)   BMI 40.18 kg/m  Gen:  WD/WN, NAD Head: [place]/AT, No temporalis wasting. Ear/Nose/Throat: Hearing grossly intact, nares w/o erythema or drainage Eyes: Conjunctiva clear. Sclera non-icteric Neck: Supple.  Trachea midline Pulmonary:  Good air movement, no use of accessory muscles.   Cardiac: RRR, no JVD Vascular:  Vessel Right Left  Radial Palpable Palpable                          PT Palpable Palpable  DP Palpable Palpable   Gastrointestinal: soft, non-tender/non-distended. No guarding/reflex.  Musculoskeletal: M/S 5/5 throughout.  No deformity or atrophy. Trace LE edema. Neurologic: Sensation grossly intact in extremities.  Symmetrical.  Speech is fluent.  Psychiatric: Judgment  intact, Mood & affect appropriate for pt's clinical situation. Dermatologic: No rashes or ulcers noted.  No cellulitis or open wounds.      Labs No results found for this or any previous visit (from the past 2160 hour(s)).  Radiology No results found.  Assessment/Plan  Aortic ectasia, abdominal (HCC) Aortic diameter stable at 2.6 cm.  We are checking aortoiliac duplex due to his peripheral arterial disease on an annual basis and will continue this.  Hypertension blood pressure control important in reducing the progression of atherosclerotic disease. On appropriate oral medications.   Hyperlipidemia lipid control important in reducing the progression of atherosclerotic disease. Continue statin therapy   Atherosclerosis of native arteries of extremity with intermittent claudication (HCC) ABIs today are stable at 1.0 on the right and 0.89 on the left with multiphasic waveforms and normal digital pressures.  The abdominal aortic maximal diameter was stable at 2.6 cm.  His hip and buttock pain is possibly partially related to vascular disease but may be more related to lumbosacral disease or hip arthritis.  No role for intervention at these levels.  Recheck in 1 year. Continue medical regimen.    Festus Barren, MD  11/15/2020 10:49 AM    This note was created with Dragon medical transcription system.  Any errors from dictation are purely unintentional

## 2020-11-15 NOTE — Assessment & Plan Note (Signed)
ABIs today are stable at 1.0 on the right and 0.89 on the left with multiphasic waveforms and normal digital pressures.  The abdominal aortic maximal diameter was stable at 2.6 cm.  His hip and buttock pain is possibly partially related to vascular disease but may be more related to lumbosacral disease or hip arthritis.  No role for intervention at these levels.  Recheck in 1 year. Continue medical regimen.

## 2021-01-08 ENCOUNTER — Ambulatory Visit
Admission: RE | Admit: 2021-01-08 | Discharge: 2021-01-08 | Disposition: A | Discharge status: Home / Self Care | Payer: No Typology Code available for payment source | Source: Ambulatory Visit | Attending: Physician Assistant | Admitting: Physician Assistant

## 2021-01-08 ENCOUNTER — Other Ambulatory Visit: Payer: Self-pay

## 2021-01-08 DIAGNOSIS — M5136 Other intervertebral disc degeneration, lumbar region: Secondary | ICD-10-CM | POA: Insufficient documentation

## 2021-01-08 DIAGNOSIS — M4807 Spinal stenosis, lumbosacral region: Secondary | ICD-10-CM | POA: Diagnosis not present

## 2021-11-14 ENCOUNTER — Other Ambulatory Visit (INDEPENDENT_AMBULATORY_CARE_PROVIDER_SITE_OTHER): Payer: Self-pay | Admitting: Vascular Surgery

## 2021-11-14 DIAGNOSIS — I70213 Atherosclerosis of native arteries of extremities with intermittent claudication, bilateral legs: Secondary | ICD-10-CM

## 2021-11-18 ENCOUNTER — Encounter (INDEPENDENT_AMBULATORY_CARE_PROVIDER_SITE_OTHER): Payer: Medicare Other

## 2021-11-18 ENCOUNTER — Ambulatory Visit (INDEPENDENT_AMBULATORY_CARE_PROVIDER_SITE_OTHER): Payer: Medicare Other | Admitting: Vascular Surgery

## 2021-11-18 ENCOUNTER — Encounter (INDEPENDENT_AMBULATORY_CARE_PROVIDER_SITE_OTHER): Payer: Self-pay

## 2022-09-27 IMAGING — MR MR LUMBAR SPINE W/O CM
5 series · 30 of 48 positions shown · non-contrast
Comparison: 05/29/2014

CLINICAL DATA: Chronic progressive low back pain and left hip pain
over the last 5 years.

EXAM:
MRI LUMBAR SPINE WITHOUT CONTRAST
TECHNIQUE: Multiplanar, multisequence MR imaging of the lumbar spine was
performed. No intravenous contrast was administered.

[Series 5: T2 · sagittal · 4.0mm · 0.81mm/px · 6 of 15 slices shown (1 of 2)]
[im 1/15]
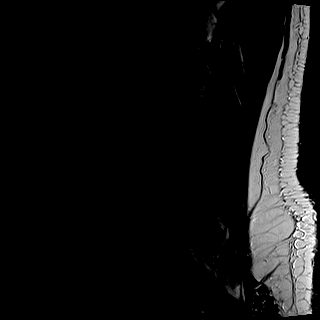
[im 3/15]
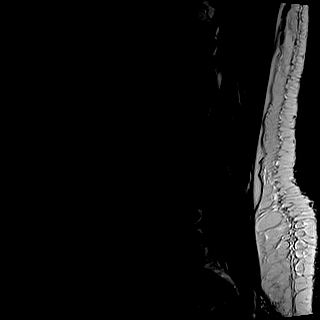
[im 6/15]
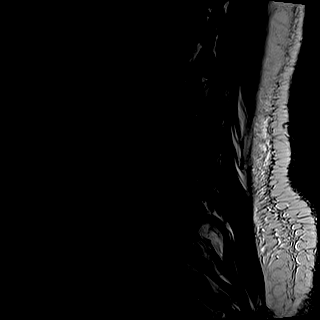
[im 9/15]
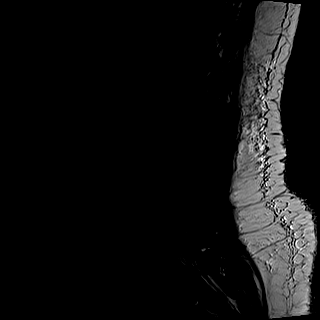
[im 12/15]
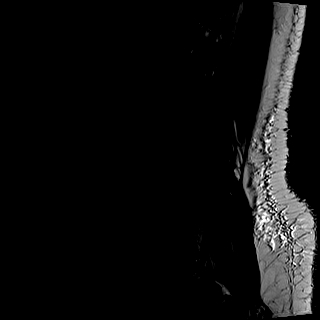
[im 15/15]
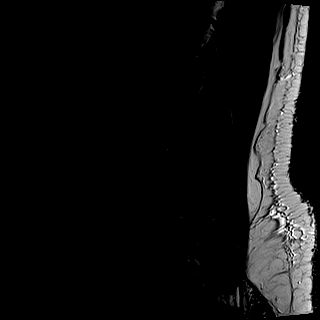

[Series 6: T1 · sagittal · 4.0mm · 0.81mm/px · 7 of 15 slices shown (1 of 2)]
[im 1/15]
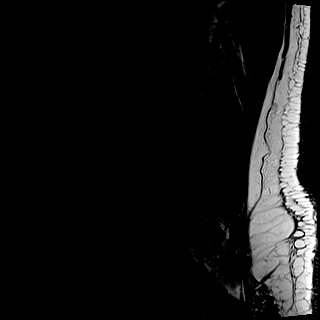
[im 3/15]
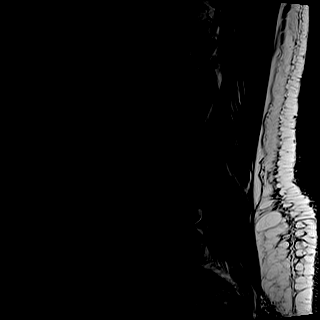
[im 5/15]
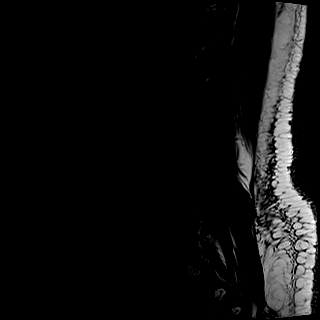
[im 8/15]
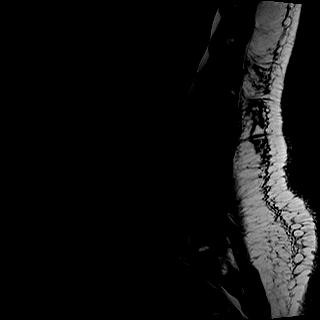
[im 10/15]
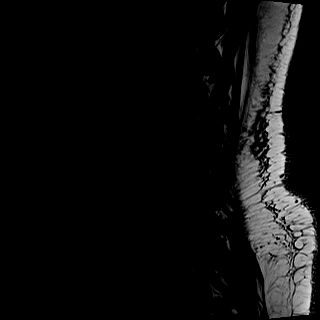
[im 12/15]
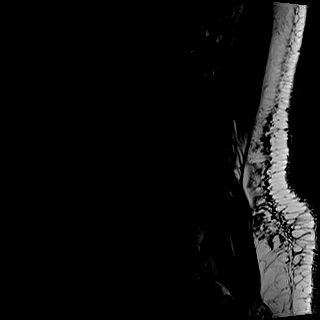
[im 15/15]
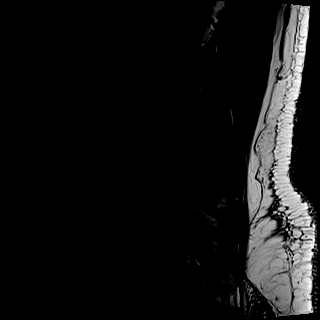

[Series 7: STIR · sagittal · 4.0mm · 0.41mm/px · 1 of 15 slices shown]
[im 1/15]
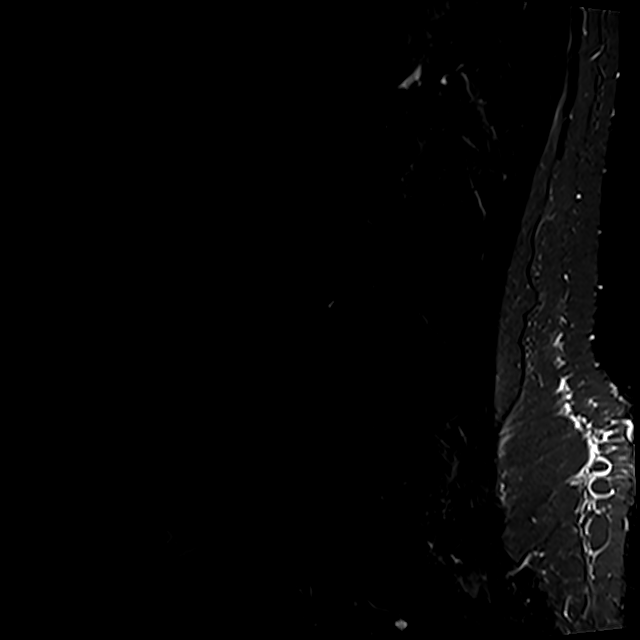

[Series 8: T2 · axial · 4.0mm · 0.78mm/px · z∈[-80,+100]mm · 8 of 29 slices shown (2 of 2)]
[im 1/29]
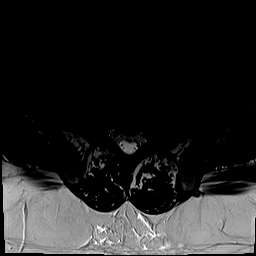
[im 5/29]
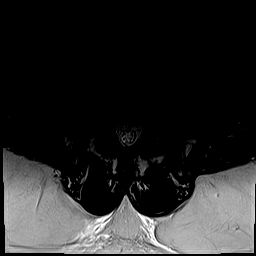
[im 9/29]
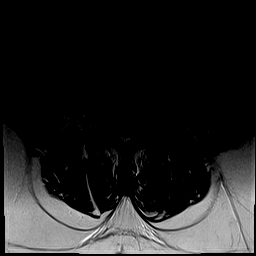
[im 13/29]
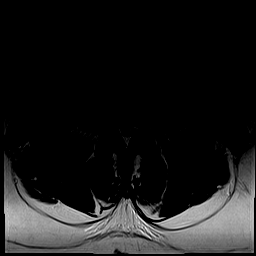
[im 16/29]
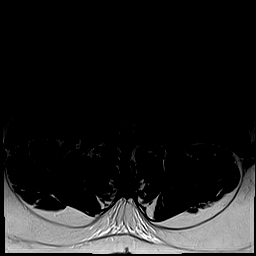
[im 20/29]
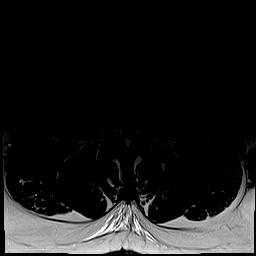
[im 24/29]
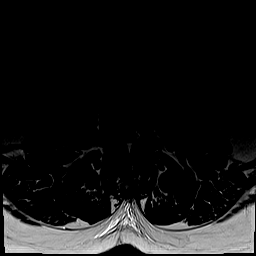
[im 29/29]
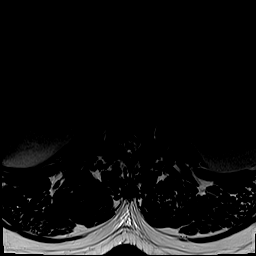

[Series 9: T1 · axial · 4.0mm · 0.39mm/px · z∈[-80,+100]mm · 8 of 29 slices shown (2 of 2)]
[im 1/29]
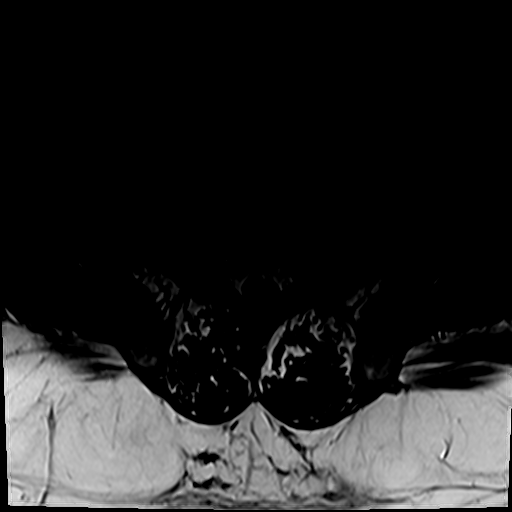
[im 5/29]
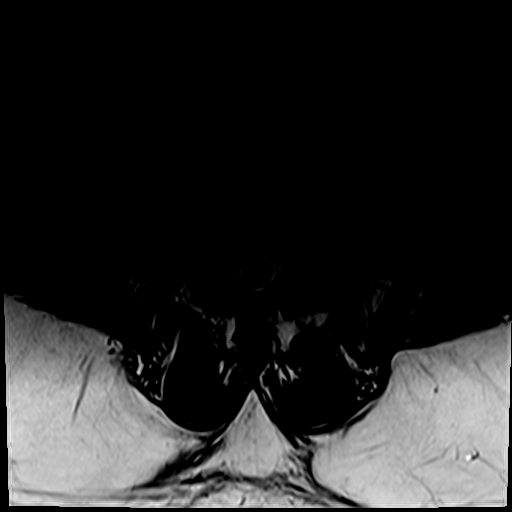
[im 9/29]
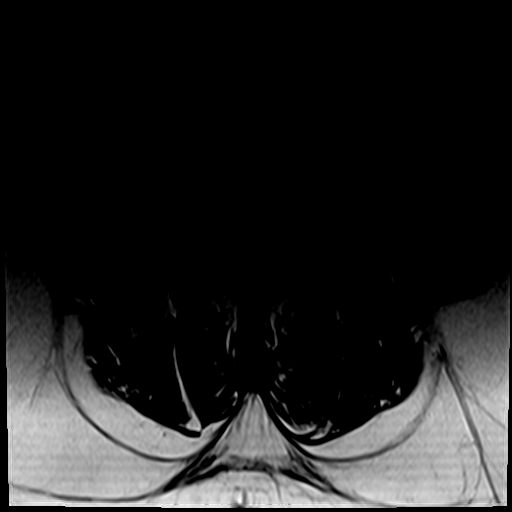
[im 13/29]
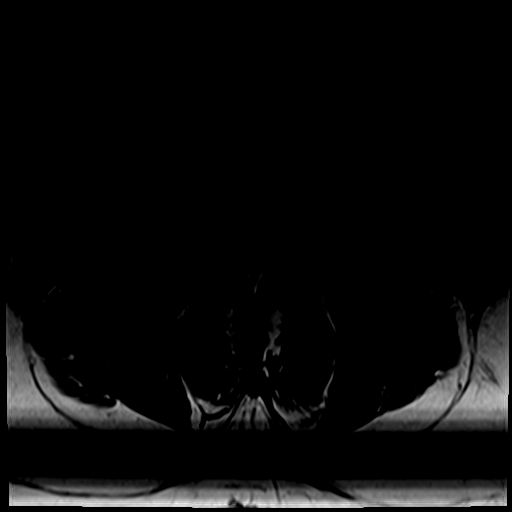
[im 16/29]
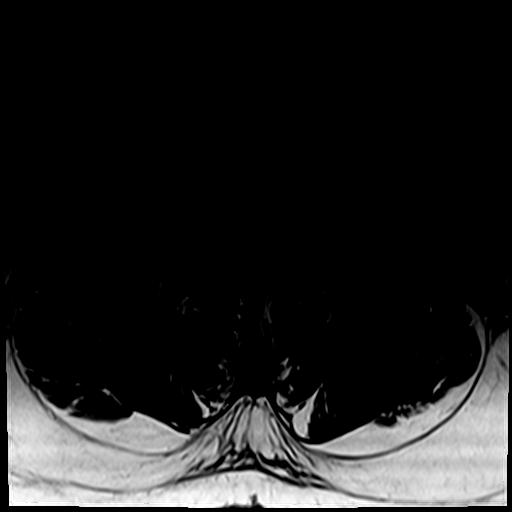
[im 20/29]
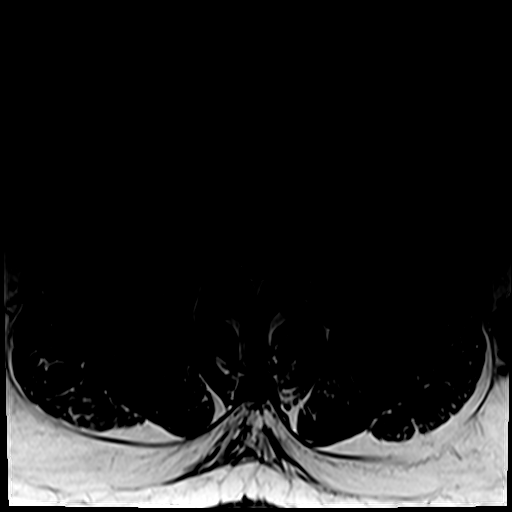
[im 24/29]
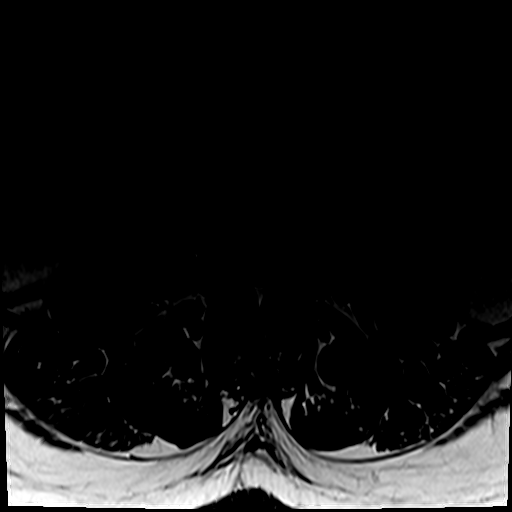
[im 29/29]
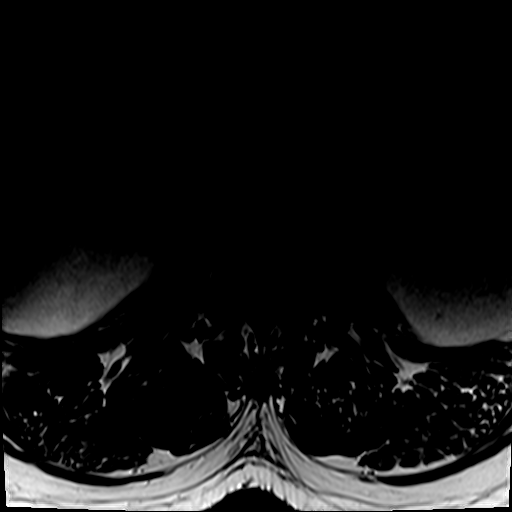

[30 of 48 positions shown; findings below may reference images not displayed]

FINDINGS: Segmentation: 5 lumbar type vertebral bodies as numbered previously.

Alignment: 3 mm of degenerative anterolisthesis at L4-5, increased 1
mm from the previous study.

Vertebrae:  No fracture or focal bone lesion.

Conus medullaris and cauda equina: Conus extends to the L1-2 level.
Conus and cauda equina appear normal.

Paraspinal and other soft tissues: Negative

Disc levels:

In general, the canal is somewhat small on a congenital basis.

Minimal non-compressive disc bulges at L2-3 and above. At T11-12,
there is a disc bulge and bilateral facet degeneration but without
visible compressive stenosis. This level was not studied in detail.

L3-4: Mild bulging of the disc. Mild facet and ligamentous
prominence. Mild canal narrowing but no likely neural compression.

L4-5: Bilateral facet arthropathy allowing 3 mm of anterolisthesis.
Mild bulging of the disc. Moderate multifactorial stenosis, most
notable within the lateral recesses and the intervertebral foramen
on the left. Neural compression could occur at this level,
particularly on the left. Additionally, the facet arthropathy could
be a cause of back pain or referred facet syndrome pain.

L5-S1: Mild bulging of the disc. Bilateral facet degeneration worse
on the left than right. No central canal stenosis. Left foraminal
narrowing that could affect the exiting L5 nerve, slightly worsened
compared to the study of 1341.
IMPRESSION: L4-5: Bilateral facet arthropathy with 3 mm of anterolisthesis,
slightly worsened when compared to the study of 1341. Mild bulging
of the disc. Moderate multifactorial spinal stenosis, most severe in
the lateral recesses and the intervertebral foramen on the left.
Neural compression could occur at this level, particularly on the
left. Additionally, the facet arthropathy could be a cause of back
pain or referred facet syndrome pain.

L3-4: Minimal disc bulge and facet degeneration. No compressive
stenosis.

L5-S1: Minimal disc bulge. Facet degeneration and hypertrophy worse
on the left. Left foraminal narrowing that could possibly affect the
left L5 nerve. Facet arthritis could contribute to pain.
